# Patient Record
Sex: Female | Born: 1995
Health system: Southern US, Community
[De-identification: ages and names within clinical notes are randomized; demographics above are authoritative.]

## PROBLEM LIST (undated history)

## (undated) ENCOUNTER — Inpatient Hospital Stay (HOSPITAL_COMMUNITY): Payer: Self-pay

## (undated) DIAGNOSIS — E119 Type 2 diabetes mellitus without complications: Secondary | ICD-10-CM

---

## 2015-01-14 ENCOUNTER — Encounter (HOSPITAL_COMMUNITY): Payer: Self-pay | Admitting: Medical

## 2015-01-14 ENCOUNTER — Inpatient Hospital Stay (HOSPITAL_COMMUNITY): Payer: BLUE CROSS/BLUE SHIELD

## 2015-01-14 ENCOUNTER — Inpatient Hospital Stay (HOSPITAL_COMMUNITY)
Admission: AD | Admit: 2015-01-14 | Discharge: 2015-01-14 | Disposition: A | Discharge status: Home / Self Care | Payer: BLUE CROSS/BLUE SHIELD | Source: Ambulatory Visit | Attending: Family Medicine | Admitting: Family Medicine

## 2015-01-14 DIAGNOSIS — Z3A14 14 weeks gestation of pregnancy: Secondary | ICD-10-CM

## 2015-01-14 DIAGNOSIS — O209 Hemorrhage in early pregnancy, unspecified: Secondary | ICD-10-CM | POA: Insufficient documentation

## 2015-01-14 DIAGNOSIS — Z3A13 13 weeks gestation of pregnancy: Secondary | ICD-10-CM | POA: Diagnosis not present

## 2015-01-14 HISTORY — DX: Type 2 diabetes mellitus without complications: E11.9

## 2015-01-14 LAB — CBC WITH DIFFERENTIAL/PLATELET
Basophils Absolute: 0 10*3/uL (ref 0.0–0.1)
Basophils Relative: 0 % (ref 0–1)
Eosinophils Absolute: 0 10*3/uL (ref 0.0–0.7)
Eosinophils Relative: 0 % (ref 0–5)
HEMATOCRIT: 36.4 % (ref 36.0–46.0)
HEMOGLOBIN: 13.2 g/dL (ref 12.0–15.0)
LYMPHS ABS: 2.6 10*3/uL (ref 0.7–4.0)
LYMPHS PCT: 28 % (ref 12–46)
MCH: 30.4 pg (ref 26.0–34.0)
MCHC: 36.3 g/dL — ABNORMAL HIGH (ref 30.0–36.0)
MCV: 83.9 fL (ref 78.0–100.0)
Monocytes Absolute: 0.6 10*3/uL (ref 0.1–1.0)
Monocytes Relative: 7 % (ref 3–12)
NEUTROS ABS: 5.9 10*3/uL (ref 1.7–7.7)
Neutrophils Relative %: 65 % (ref 43–77)
PLATELETS: 244 10*3/uL (ref 150–400)
RBC: 4.34 MIL/uL (ref 3.87–5.11)
RDW: 13 % (ref 11.5–15.5)
WBC: 9.2 10*3/uL (ref 4.0–10.5)

## 2015-01-14 LAB — HCG, QUANTITATIVE, PREGNANCY: hCG, Beta Chain, Quant, S: 42445 m[IU]/mL — ABNORMAL HIGH (ref <5)

## 2015-01-14 LAB — WET PREP, GENITAL
Trich, Wet Prep: NONE SEEN
WBC WET PREP: NONE SEEN
Yeast Wet Prep HPF POC: NONE SEEN

## 2015-01-14 LAB — ABO/RH: ABO/RH(D): B POS

## 2015-01-14 NOTE — Discharge Instructions (Signed)

## 2015-01-14 NOTE — MAU Provider Note (Signed)
History     CSN: [id number]  Arrival date and time: 01/14/15 1259   First Provider Initiated Contact with Patient 01/14/15 1323      Chief Complaint  Patient presents with  . Possible Pregnancy  . Abdominal Pain  . Vaginal Bleeding   HPI  Ms. Tiffany Saunders is a 19 y.o. G1P0 at unknown GA who presents to MAU today with complaint of vaginal bleeding. The patient states +UPT at school in March and then [place] bleeding [place]. She states that lightened to spotting only today. She denies abdominal pain, fever, N/V/D, vaginal discharge or fever. She states her last normal periods was early January.    OB History    Gravida Para Term Preterm AB TAB SAB Ectopic Multiple Living   1               Past Medical History  Diagnosis Date  . Diabetes mellitus without complication     History reviewed. No pertinent past surgical history.  History reviewed. No pertinent family history.  History  Substance Use Topics  . Smoking status: Never Smoker   . Smokeless tobacco: Not on file  . Alcohol Use: No    Allergies: No Known Allergies  Prescriptions prior to admission  Medication Sig Dispense Refill Last Dose  . insulin aspart (NOVOLOG) 100 UNIT/ML FlexPen Inject 1-30 Units into the skin 3 (three) times daily with meals. 1 unit of insulin/10carbs   01/14/2015 at Unknown time  . insulin glargine (LANTUS) 100 UNIT/ML injection Inject 30 Units into the skin at bedtime.   01/13/2015 at Unknown time    Review of Systems  Constitutional: Negative for fever and malaise/fatigue.  Gastrointestinal: Negative for nausea, vomiting, abdominal pain, diarrhea and constipation.  Genitourinary:       Neg - vaginal discharge + vaginal bleeding  Neurological: Negative for dizziness.   Physical Exam   Blood pressure 132/73, pulse 108, temperature 98.1 F (36.7 C), temperature source Oral, resp. rate 18, height 5' (1.524 m), weight 141 lb (63.957 kg), last menstrual  period 10/08/2014.  Physical Exam  Nursing note and vitals reviewed. Constitutional: She is oriented to person, place, and time. She appears well-developed and well-nourished. No distress.  HENT:  Head: Normocephalic.  Cardiovascular: Normal rate.   Respiratory: Effort normal.  GI: Soft. She exhibits no distension and no mass. There is no tenderness. There is no rebound and no guarding.  Genitourinary: Uterus is enlarged (slightly). Uterus is not tender. Cervix exhibits no motion tenderness, no discharge and no friability. Right adnexum displays no mass and no tenderness. Left adnexum displays no mass and no tenderness. There is bleeding (scant light pink) in the vagina. Vaginal discharge (scant thin, pink discharge noted) found.  Neurological: She is alert and oriented to person, place, and time.  Skin: Skin is warm and dry. No erythema.  Psychiatric: She has a normal mood and affect.   Results for orders placed or performed during the hospital encounter of 01/14/15 (from the past 24 hour(s))  Wet prep, genital     Status: Abnormal   Collection Time: 01/14/15  2:09 PM  Result Value Ref Range   Yeast Wet Prep HPF POC NONE SEEN NONE SEEN   Trich, Wet Prep NONE SEEN NONE SEEN   Clue Cells Wet Prep HPF POC FEW (A) NONE SEEN   WBC, Wet Prep HPF POC NONE SEEN NONE SEEN  CBC with Differential/Platelet     Status: Abnormal   Collection  Time: 01/14/15  2:19 PM  Result Value Ref Range   WBC 9.2 4.0 - 10.5 K/uL   RBC 4.34 3.87 - 5.11 MIL/uL   Hemoglobin 13.2 12.0 - 15.0 g/dL   HCT 16.1 09.6 - 04.5 %   MCV 83.9 78.0 - 100.0 fL   MCH 30.4 26.0 - 34.0 pg   MCHC 36.3 (H) 30.0 - 36.0 g/dL   RDW 40.9 81.1 - 91.4 %   Platelets 244 150 - 400 K/uL   Neutrophils Relative % 65 43 - 77 %   Neutro Abs 5.9 1.7 - 7.7 K/uL   Lymphocytes Relative 28 12 - 46 %   Lymphs Abs 2.6 0.7 - 4.0 K/uL   Monocytes Relative 7 3 - 12 %   Monocytes Absolute 0.6 0.1 - 1.0 K/uL   Eosinophils Relative 0 0 - 5 %    Eosinophils Absolute 0.0 0.0 - 0.7 K/uL   Basophils Relative 0 0 - 1 %   Basophils Absolute 0.0 0.0 - 0.1 K/uL  ABO/Rh     Status: None   Collection Time: 01/14/15  2:19 PM  Result Value Ref Range   ABO/RH(D) B POS   hCG, quantitative, pregnancy     Status: Abnormal   Collection Time: 01/14/15  2:19 PM  Result Value Ref Range   hCG, Beta Chain, Quant, S 42445 (H) <5 mIU/mL   US Ob Comp Less 14 Wks  01/14/2015   CLINICAL DATA:  Initial encounter for positive pregnancy test with vaginal bleeding.  EXAM: OBSTETRIC <14 WK ULTRASOUND  TECHNIQUE: Transabdominal ultrasound was performed for evaluation of the gestation as well as the maternal uterus and adnexal regions.  COMPARISON:  None.  FINDINGS: Intrauterine gestational sac: Visualized/normal in shape.  Yolk sac:  Nonvisualized  Embryo:  Visualized  Cardiac Activity: Visualize  Heart Rate: 156 bpm  CRL:   7.8 cm 13 w 6 d                  [place] EDC: 07/15/2015.  Maternal uterus/adnexae: No evidence for subchorionic hemorrhage. Maternal right ovary is unremarkable. Maternal left ovary cannot be visualized.  IMPRESSION: Single living intrauterine gestation at estimated 13 week 6 day gestational age by crown-rump length. Dedicated fetal anatomic survey is recommended by [redacted] weeks gestational age.   Electronically Signed   By: Kennith Center M.D.   On: 01/14/2015 16:23    MAU Course  Procedures None  MDM + UPT  CBC, ABO/Rh, quant hCG, HIV, RPR, wet prep, GC/Chlamydia and [place] today  Assessment and Plan  A: SIUP at [redacted]w[redacted]d Vaginal bleeding in pregnancy  P: Discharge home Bleeding precautions discussed Patient given pregnancy confirmation letter given with list of area OB providers Second trimester warning signs discussed Patient advised to start prenatal vitamins daily Patient may return to MAU as needed or if her condition were to change or worsen   Marny Lowenstein, PA-C  01/14/2015, 4:30 PM

## 2015-01-14 NOTE — MAU Note (Signed)
3 wks ago went to health office at her school, was told she was pregnant.  Easter weekend to April 2 experienced heavy bleeding. This week the bleeding is lighter.  Did not come in them as she did not have a ride. Had cramps, but have stopped.

## 2015-01-15 LAB — RPR: RPR Ser Ql: NONREACTIVE

## 2015-01-17 LAB — GC/CHLAMYDIA PROBE AMP (~~LOC~~) NOT AT ARMC
CHLAMYDIA, DNA PROBE: NEGATIVE
Neisseria Gonorrhea: NEGATIVE

## 2015-01-21 LAB — HIV 1/2 AB DIFFERENTIATION
HIV 1 AB: NONREACTIVE
HIV 2 AB: NONREACTIVE

## 2015-01-21 LAB — RNA QUALITATIVE: HIV 1 RNA QUALITATIVE: NEGATIVE

## 2015-01-21 LAB — HIV ANTIBODY (ROUTINE TESTING W REFLEX): HIV Screen 4th Generation wRfx: REACTIVE — AB

## 2015-03-30 DIAGNOSIS — O0930 Supervision of pregnancy with insufficient antenatal care, unspecified trimester: Secondary | ICD-10-CM | POA: Insufficient documentation

## 2015-11-19 ENCOUNTER — Encounter (HOSPITAL_COMMUNITY): Payer: Self-pay | Admitting: *Deleted

## 2015-12-08 IMAGING — US US OB COMP LESS 14 WK
1 series · 14 of 28 positions shown · non-contrast
Comparison: None.

CLINICAL DATA: Initial encounter for positive pregnancy test with
vaginal bleeding.

EXAM:
OBSTETRIC <14 WK ULTRASOUND
TECHNIQUE: Transabdominal ultrasound was performed for evaluation of the
gestation as well as the maternal uterus and adnexal regions.

[Series 1: us ob comp less 14 wk · 34 acquisitions, 14 frames shown]
[im 2/34]
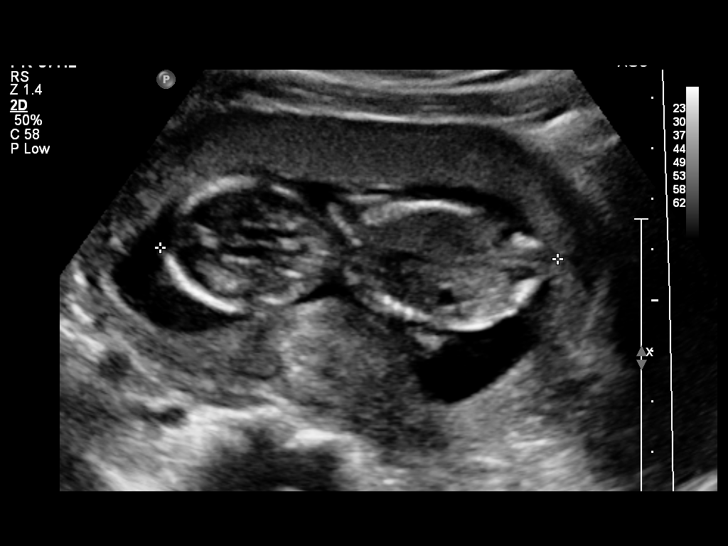
[im 4/34]
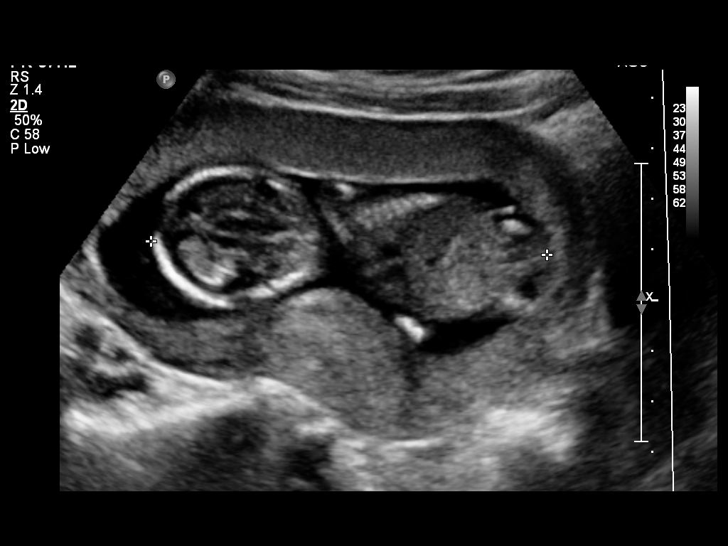
[im 7/34]
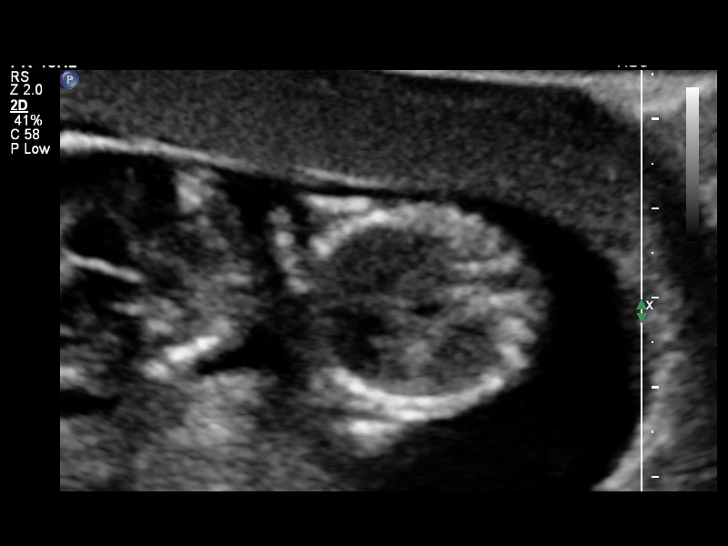
[im 9/34]
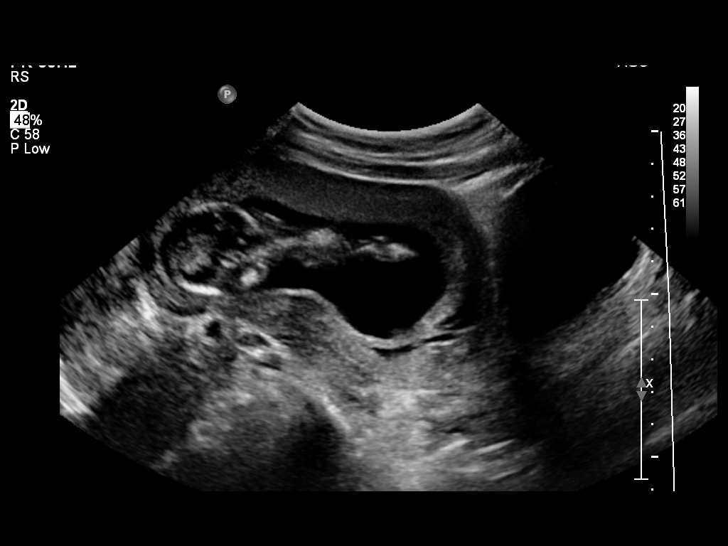
[im 12/34]
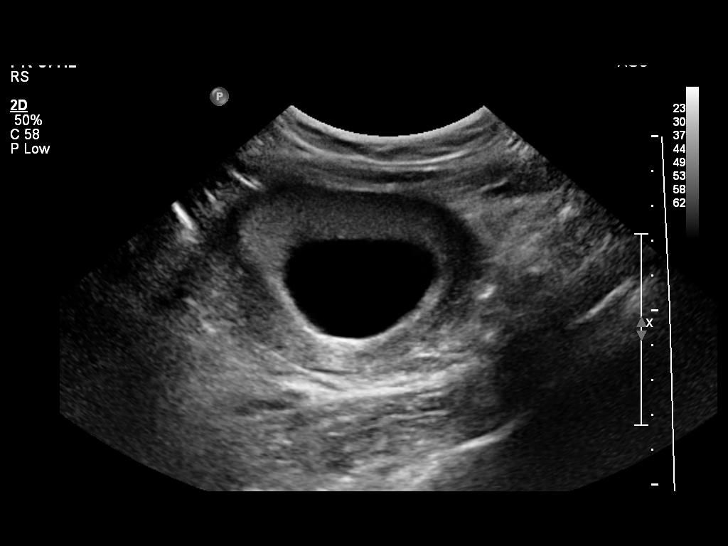
[im 14/34]
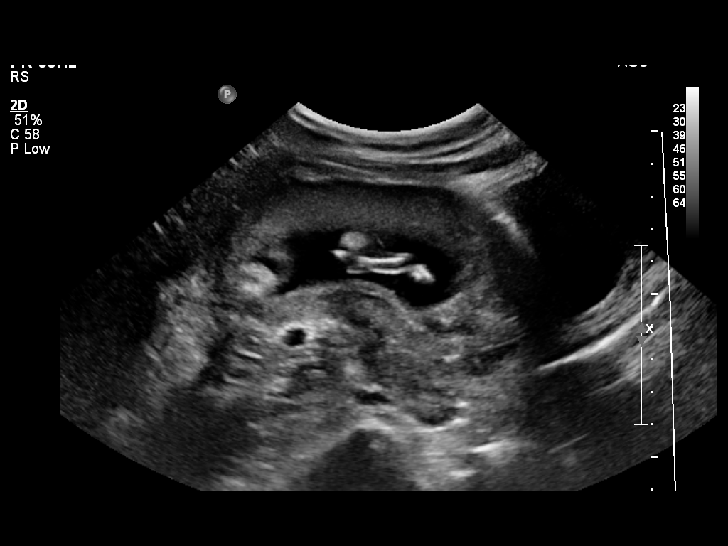
[im 16/34]
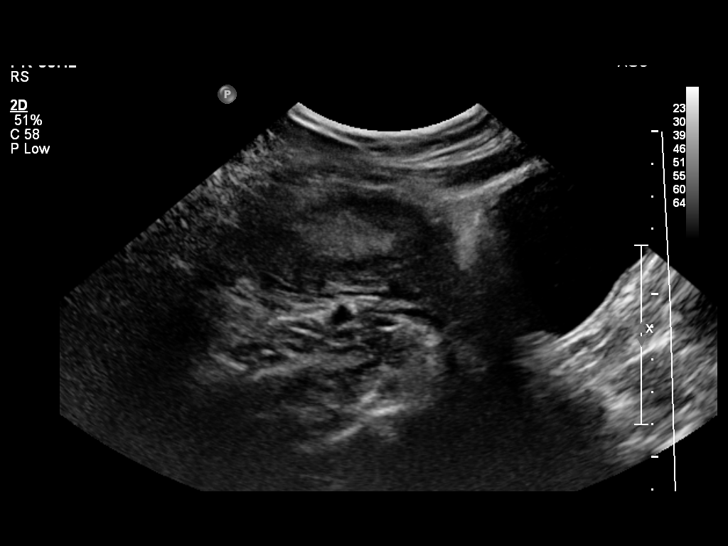
[im 19/34]
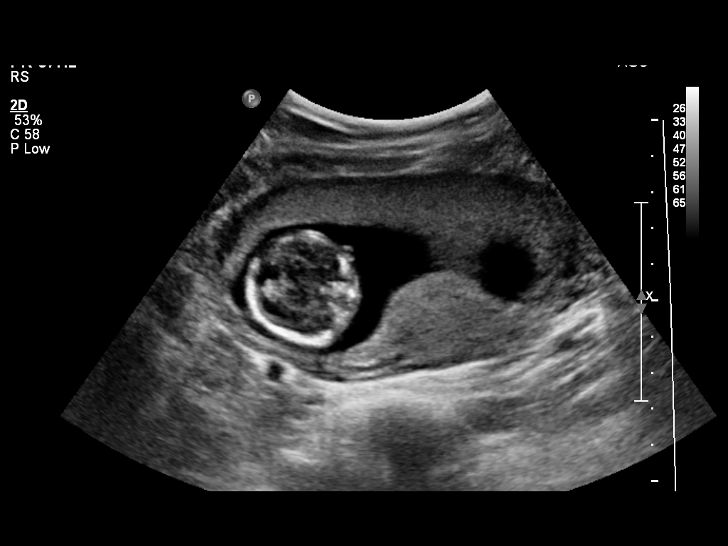
[im 21/34]
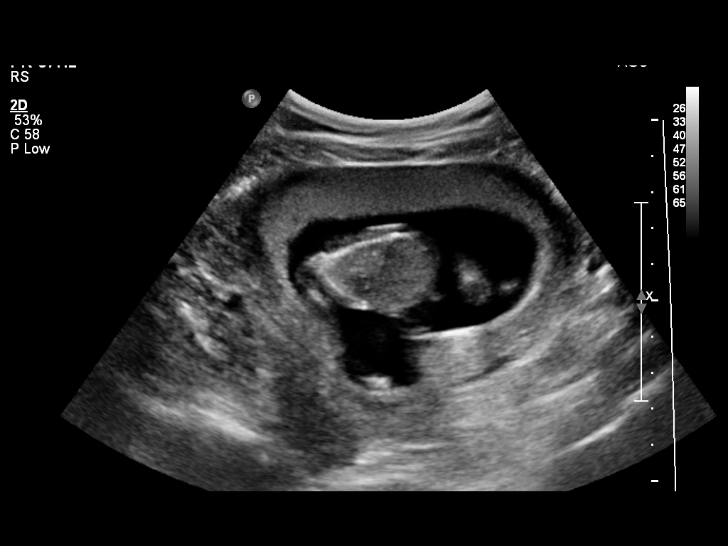
[im 24/34]
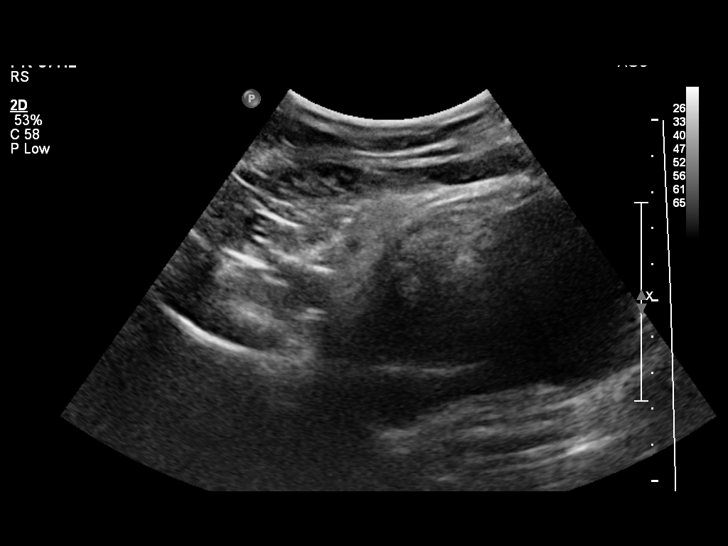
[im 26/34]
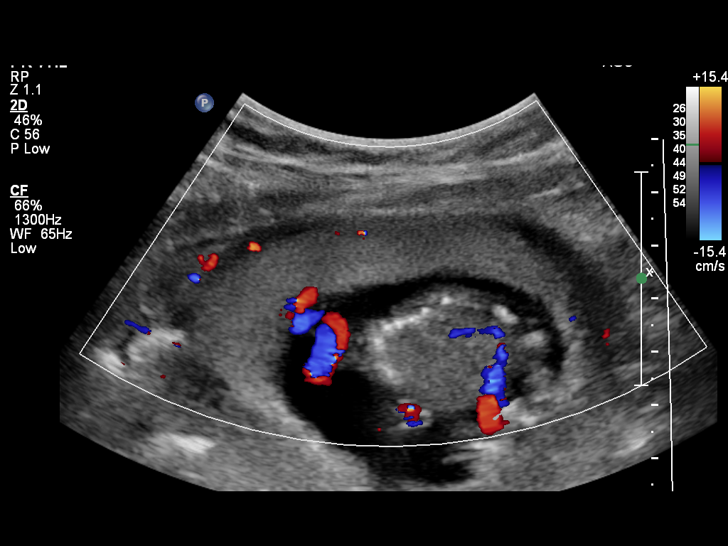
[im 29/34]
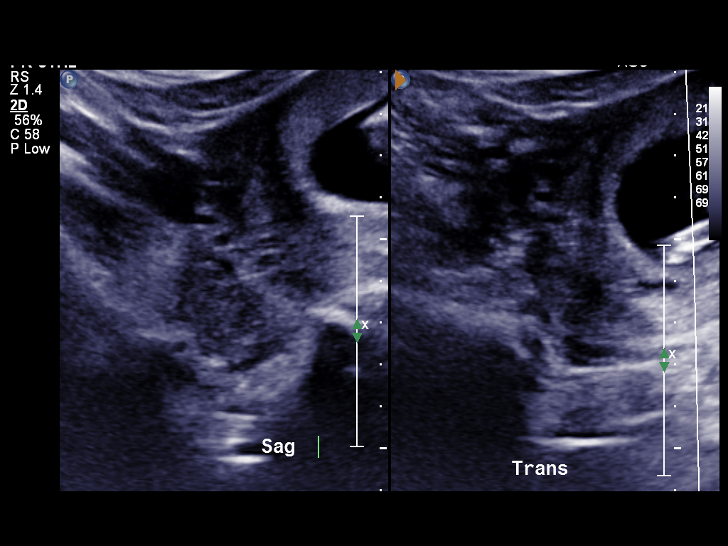
[im 31/34]
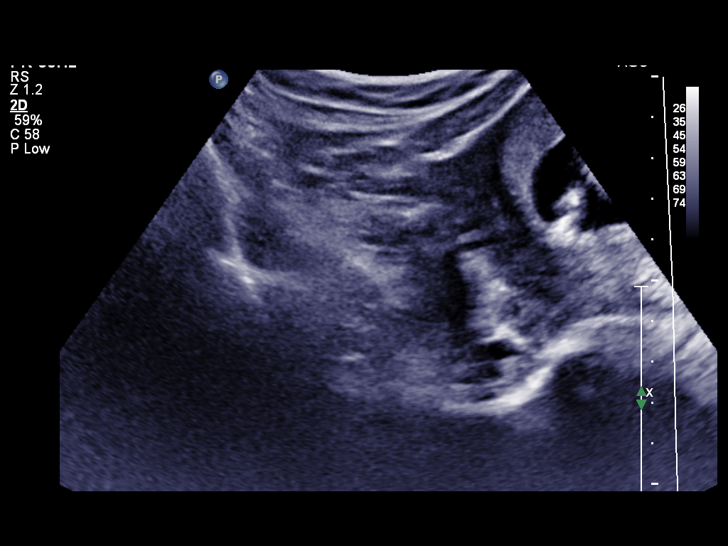
[im 34/34]
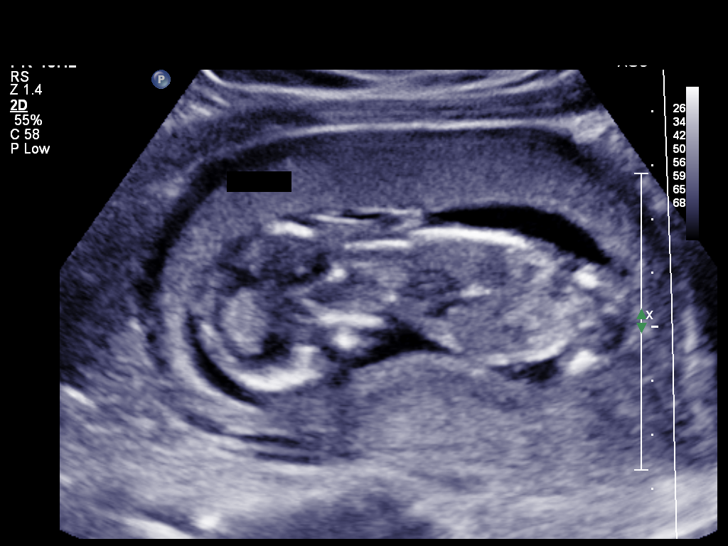

[14 of 28 positions shown; findings below may reference images not displayed]

FINDINGS: Intrauterine gestational sac: Visualized/normal in shape.

Yolk sac:  Nonvisualized

Embryo:  Visualized

Cardiac Activity: Visualize

Heart Rate: 156 bpm

CRL:   7.8 cm 13 w 6 d                  US EDC: 07/15/2015.

Maternal uterus/adnexae: No evidence for subchorionic hemorrhage.
Maternal right ovary is unremarkable. Maternal left ovary cannot be
visualized.
IMPRESSION: Single living intrauterine gestation at estimated 13 week 6 day
gestational age by crown-rump length. Dedicated fetal anatomic
survey is recommended by 18 weeks gestational age.

## 2020-01-28 ENCOUNTER — Ambulatory Visit (INDEPENDENT_AMBULATORY_CARE_PROVIDER_SITE_OTHER): Payer: 59 | Admitting: Medical-Surgical

## 2020-01-28 ENCOUNTER — Other Ambulatory Visit: Payer: Self-pay

## 2020-01-28 ENCOUNTER — Encounter: Payer: Self-pay | Admitting: Medical-Surgical

## 2020-01-28 VITALS — BP 126/76 | HR 89 | Temp 98.5°F | Ht 60.5 in | Wt 150.9 lb

## 2020-01-28 DIAGNOSIS — Z3041 Encounter for surveillance of contraceptive pills: Secondary | ICD-10-CM

## 2020-01-28 DIAGNOSIS — E109 Type 1 diabetes mellitus without complications: Secondary | ICD-10-CM | POA: Diagnosis not present

## 2020-01-28 DIAGNOSIS — Z7689 Persons encountering health services in other specified circumstances: Secondary | ICD-10-CM

## 2020-01-28 LAB — POCT GLYCOSYLATED HEMOGLOBIN (HGB A1C): Hemoglobin A1C: 10.4 % — AB (ref 4.0–5.6)

## 2020-01-28 MED ORDER — SPRINTEC 28 0.25-35 MG-MCG PO TABS: 1.0000 | ORAL_TABLET | Freq: Every day | ORAL | 3 refills | Status: DC

## 2020-01-28 NOTE — Progress Notes (Signed)
New Patient Office Visit  Subjective:  Patient ID: Tiffany Saunders, female    DOB: [birth year]  Age: 24 y.o. MRN: [id number]  CC: No chief complaint on file.   HPI Tiffany Saunders presents to establish care.  Diabetes mellitus type 1- currently using Novolog and Basaglar. Not checking blood sugars regularly but reports she is getting a continuous monitor soon. Using 1-8 units Novolog TID with meals depending on carbs. Taking 20 units Basaglar at night. Admits to eating sweets, difficulty with adherence to diabetic diet recommendations.   Birth control- long-term birth control with Sprintec. Tolerating the medication well. Regular periods, skips a period once or twice a year.   Past Medical History:  Diagnosis Date  . Diabetes mellitus without complication [facility])     Past Surgical History:  Procedure Laterality Date  . CESAREAN SECTION      Family History  Problem Relation Age of Onset  . Hypertension Mother   . Diabetes Maternal Grandmother   . Hypertension Paternal Grandmother     Social History   Socioeconomic History  . Marital status: Single    Spouse name: Not on file  . Number of children: Not on file  . Years of education: Not on file  . Highest education level: Not on file  Occupational History  . Not on file  Tobacco Use  . Smoking status: Never Smoker  . Smokeless tobacco: Never Used  Substance and Sexual Activity  . Alcohol use: Yes    Comment: 1-2 glasses of wine/month  . Drug use: Yes    Types: Marijuana    Comment: daily use  . Sexual activity: Yes    Partners: Male    Birth control/protection: Pill  Other Topics Concern  . Not on file  Social History Narrative  . Not on file   Social Determinants of Health   Financial Resource Strain:   . Difficulty of Paying Living Expenses:   Food Insecurity:   . Worried About Programme researcher, broadcasting/film/video in the Last Year:   . Barista in the Last Year:   Transportation Needs:   . Freight forwarder  (Medical):   Marland Kitchen Lack of Transportation (Non-Medical):   Physical Activity:   . Days of Exercise per Week:   . Minutes of Exercise per Session:   Stress:   . Feeling of Stress :   Social Connections:   . Frequency of Communication with Friends and Family:   . Frequency of Social Gatherings with Friends and Family:   . Attends Religious Services:   . Active Member of Clubs or Organizations:   . Attends Banker Meetings:   Marland Kitchen Marital Status:   Intimate Partner Violence:   . Fear of Current or Ex-Partner:   . Emotionally Abused:   Marland Kitchen Physically Abused:   . Sexually Abused:     ROS Review of Systems  Constitutional: Negative for chills, fatigue, fever and unexpected weight change.  Respiratory: Negative for cough, chest tightness, shortness of breath and wheezing.   Cardiovascular: Negative for chest pain, palpitations and leg swelling.  Gastrointestinal: Negative for abdominal pain, constipation, diarrhea and nausea.  Genitourinary: Negative for dysuria, frequency and urgency.  Neurological: Negative for numbness.  Psychiatric/Behavioral: Negative for self-injury, sleep disturbance and suicidal ideas. The patient is not nervous/anxious.     Objective:   Today's Vitals: BP 126/76   Pulse 89   Temp 98.5 F (36.9 C) (Oral)   Ht 5' 0.5" (1.537 m)  Wt 150 lb 14.4 oz (68.4 kg)   LMP 01/25/2020   SpO2 97%   BMI 28.99 kg/m   Physical Exam Vitals reviewed.  Constitutional:      General: She is not in acute distress.    Appearance: Normal appearance.  HENT:     Head: Normocephalic and atraumatic.  Cardiovascular:     Rate and Rhythm: Normal rate and regular rhythm.     Pulses: Normal pulses.     Heart sounds: Normal heart sounds. No murmur. No friction rub. No gallop.   Pulmonary:     Effort: Pulmonary effort is normal. No respiratory distress.     Breath sounds: Normal breath sounds. No wheezing.  Skin:    General: Skin is warm and dry.  Neurological:      Mental Status: She is alert and oriented to person, place, and time.  Psychiatric:        Mood and Affect: Mood normal.        Behavior: Behavior normal.        Thought Content: Thought content normal.        Judgment: Judgment normal.     Assessment & Plan:   1. Visit for birth control pills maintenance Continue Sprintec, refills provided. - SPRINTEC 28 0.25-35 MG-MCG tablet; Take 1 tablet by mouth daily.  Dispense: 3 Package; Refill: 3  2. Type 1 diabetes mellitus without complications (HCC) POCT HgbA1c 10.4%. Referring to [facility] per patient request. - Ambulatory referral to Endocrinology - POCT glycosylated hemoglobin (Hb A1C)  Outpatient Encounter Medications as of 01/28/2020  Medication Sig  . HUMALOG KWIKPEN 100 UNIT/ML KwikPen AS DIRECTED ON A SLIDING SCALE  . Insulin Glargine (BASAGLAR KWIKPEN) 100 UNIT/ML Inject 20 Units into the skin at bedtime.  . SPRINTEC 28 0.25-35 MG-MCG tablet Take 1 tablet by mouth daily.  Tyler Aas FLEXTOUCH 100 UNIT/ML FlexTouch Pen Inject 20 Units into the skin at bedtime.  . [DISCONTINUED] SPRINTEC 28 0.25-35 MG-MCG tablet Take 1 tablet by mouth daily.  . insulin aspart (NOVOLOG) 100 UNIT/ML FlexPen Inject 1-30 Units into the skin 3 (three) times daily with meals. 1 unit of insulin/10carbs  . [DISCONTINUED] insulin glargine (LANTUS) 100 UNIT/ML injection Inject 30 Units into the skin at bedtime.   No facility-administered encounter medications on file as of 01/28/2020.    Follow-up: Return in about 1 year (around 01/27/2021) for birth control follow up; CPE at your convenience.   Clearnce Sorrel, DNP, APRN, FNP-BC [facility]

## 2020-02-22 ENCOUNTER — Telehealth: Payer: Self-pay

## 2020-02-22 ENCOUNTER — Encounter: Payer: Self-pay | Admitting: Internal Medicine

## 2020-02-22 DIAGNOSIS — Z3041 Encounter for surveillance of contraceptive pills: Secondary | ICD-10-CM

## 2020-02-22 MED ORDER — SPRINTEC 28 0.25-35 MG-MCG PO TABS: 1.0000 | ORAL_TABLET | Freq: Every day | ORAL | 3 refills | Status: DC

## 2020-02-22 NOTE — Progress Notes (Signed)
This encounter was created in error - please disregard.

## 2020-02-22 NOTE — Telephone Encounter (Signed)
Pt called and stated requesting that we resend her Rx's for Sprintec and her insulins to [facility] pharmacy. Advised pt that she would need to contact her Endocrinologist regarding having the Rx's resent in but Joy verbally authorized resending the Sprintec. I called [facility] and cancelled the Rx there and verified that she had not picked the Rx up. Rx has been resent to the requested pharmacy.

## 2020-02-23 ENCOUNTER — Telehealth: Payer: Self-pay

## 2020-02-23 MED FILL — FEMYNOR 0.25-35 MG-MCG TABS: 0.25-35 | 84 days supply | Qty: 84 | Fill #0

## 2020-02-23 NOTE — Telephone Encounter (Signed)
Ok for generic

## 2020-02-23 NOTE — Telephone Encounter (Signed)
The pharmacy called and states the Sprintec was sent as DAW. They state the patient is ok with generic. Is it ok for generic?

## 2020-02-23 NOTE — Telephone Encounter (Signed)
Pharmacy advised  

## 2020-02-25 MED FILL — HUMALOG 100 UNITS/ML KWIKPE: 100 | 90 days supply | Qty: 63 | Fill #0

## 2020-03-01 ENCOUNTER — Ambulatory Visit: Payer: 59 | Admitting: Internal Medicine

## 2020-03-01 DIAGNOSIS — Z0289 Encounter for other administrative examinations: Secondary | ICD-10-CM

## 2020-03-01 NOTE — Progress Notes (Deleted)
Name: Tiffany Saunders  MRN/ DOB: [id number], [birth year]   Age/ Sex: 24 y.o., female    PCP: Christen Butter, NP   Reason for Endocrinology Evaluation: Type 1 Diabetes Mellitus     Date of Initial Endocrinology Visit: 03/01/2020     PATIENT IDENTIFIER: Tiffany Saunders is a 24 y.o. female with a past medical history of T1DM. The patient presented for initial endocrinology clinic visit on 03/01/2020 for consultative assistance with her diabetes management.    HPI: Tiffany Saunders was    Diagnosed with DM *** Prior Medications tried/Intolerance: *** Currently checking blood sugars *** x / day,  before breakfast and ***.  Hypoglycemia episodes : ***               Symptoms: ***                 Frequency: ***/  Hemoglobin A1c has ranged from *** in ***, peaking at *** in ***. Patient required assistance for hypoglycemia:  Patient has required hospitalization within the last 1 year from hyper or hypoglycemia:   In terms of diet, the patient ***   HOME DIABETES REGIMEN: Tresiba Humalog   Statin: no ACE-I/ARB: no Prior Diabetic Education: {Yes/No:11203}   METER DOWNLOAD SUMMARY: Date range evaluated: *** Fingerstick Blood Glucose Tests = *** Average Number Tests/Day = *** Overall Mean FS Glucose = *** Standard Deviation = ***  BG Ranges: Low = *** High = ***   Hypoglycemic Events/30 Days: BG < 50 = *** Episodes of symptomatic severe hypoglycemia = ***   DIABETIC COMPLICATIONS: Microvascular complications:   ***  Denies: ***  Last eye exam: Completed   Macrovascular complications:   ***  Denies: CAD, PVD, CVA   PAST HISTORY: Past Medical History:  Past Medical History:  Diagnosis Date  . Diabetes mellitus without complication [facility])     Past Surgical History:  Past Surgical History:  Procedure Laterality Date  . CESAREAN SECTION        Social History:  reports that she has never smoked. She has never used smokeless tobacco. She reports current alcohol use. She  reports current drug use. Drug: Marijuana. Family History:  Family History  Problem Relation Age of Onset  . Hypertension Mother   . Diabetes Maternal Grandmother   . Hypertension Paternal Grandmother       HOME MEDICATIONS: Allergies as of 03/01/2020   No Known Allergies     Medication List       Accurate as of Mar 01, 2020  7:12 AM. If you have any questions, ask your nurse or doctor.        Basaglar KwikPen 100 UNIT/ML Inject 20 Units into the skin at bedtime.   HumaLOG KwikPen 100 UNIT/ML KwikPen Generic drug: insulin lispro AS DIRECTED ON A SLIDING SCALE   Sprintec 28 0.25-35 MG-MCG tablet Generic drug: norgestimate-ethinyl estradiol Take 1 tablet by mouth daily.   Evaristo Bury FlexTouch 100 UNIT/ML FlexTouch Pen Generic drug: insulin degludec Inject 20 Units into the skin at bedtime.        ALLERGIES: No Known Allergies   REVIEW OF SYSTEMS: A comprehensive ROS was conducted with the patient and is negative except as per HPI and below:  ROS    OBJECTIVE:   VITAL SIGNS: There were no vitals taken for this visit.   PHYSICAL EXAM:  General: Pt appears well and is in NAD  Hydration: Well-hydrated with moist mucous membranes and good skin turgor  HEENT: Head: Unremarkable with good dentition.  Oropharynx clear without exudate.  Eyes: External eye exam normal without stare, lid lag or exophthalmos.  EOM intact.  PERRL.  Neck: General: Supple without adenopathy or carotid bruits. Thyroid: Thyroid size normal.  No goiter or nodules appreciated. No thyroid bruit.  Lungs: Clear with good BS bilat with no rales, rhonchi, or wheezes  Heart: RRR with normal S1 and S2 and no gallops; no murmurs; no rub  Abdomen: Normoactive bowel sounds, soft, nontender, without masses or organomegaly palpable  Extremities:  Lower extremities - No pretibial edema. No lesions.  Skin: Normal texture and temperature to palpation. No rash noted. No Acanthosis nigricans/skin tags. No  lipohypertrophy.  Neuro: MS is good with appropriate affect, pt is alert and Ox3    DM foot exam:    DATA REVIEWED:  Lab Results  Component Value Date   HGBA1C 10.4 (A) 01/28/2020     ASSESSMENT / PLAN / RECOMMENDATIONS:   1) Type 1 Diabetes Mellitus, Poorly controlled, With*** complications - Most recent A1c of 10.4 %. Goal A1c < 7.0 %.    Plan: GENERAL:  ***  MEDICATIONS:  ***  EDUCATION / INSTRUCTIONS:  BG monitoring instructions: Patient is instructed to check her blood sugars *** times a day, ***.  Call [place] [facility] clinic if: BG persistently < 70 or > 300. . I reviewed the Rule of 15 for the treatment of hypoglycemia in detail with the patient. Literature supplied.   2) Diabetic complications:   Eye: Does *** have known diabetic retinopathy.   Neuro/ Feet: Does *** have known diabetic peripheral neuropathy.  Renal: Patient does *** have known baseline CKD. She is *** on an ACEI/ARB at present.Check urine albumin/creatinine ratio yearly starting at time of diagnosis. If albuminuria is positive, treatment is geared toward better glucose, blood pressure control and use of ACE inhibitors or ARBs. Monitor electrolytes and creatinine once to twice yearly.   3) Lipids: Patient is *** on a statin.    4) Hypertension: ***  at goal of < 140/90 mmHg.       Signed electronically by: Mack Guise, MD  [facility]  [facility] [address] [place], [place] [address] Phone: [phone] FAX: [phone]   CC: [address], [place] [address] Phone: [phone]  Fax: [phone]    Return to Endocrinology clinic as below: Future Appointments  Date Time Provider [place]  03/01/2020 11:10 AM Taleigha Pinson, Melanie Crazier, MD [facility] None

## 2020-03-06 ENCOUNTER — Telehealth: Payer: 59 | Admitting: Nurse Practitioner

## 2020-03-06 DIAGNOSIS — B373 Candidiasis of vulva and vagina: Secondary | ICD-10-CM

## 2020-03-06 DIAGNOSIS — B3731 Acute candidiasis of vulva and vagina: Secondary | ICD-10-CM

## 2020-03-06 MED ORDER — FLUCONAZOLE 150 MG PO TABS: 150.0000 mg | ORAL_TABLET | Freq: Once | ORAL | 0 refills | Status: AC

## 2020-03-06 NOTE — Progress Notes (Signed)

## 2020-03-08 ENCOUNTER — Telehealth: Payer: 59 | Admitting: Physician Assistant

## 2020-03-08 DIAGNOSIS — N760 Acute vaginitis: Secondary | ICD-10-CM

## 2020-03-08 MED ORDER — METRONIDAZOLE 500 MG PO TABS: 500.0000 mg | ORAL_TABLET | Freq: Two times a day (BID) | ORAL | 0 refills | Status: DC

## 2020-03-08 MED FILL — metroNIDAZOLE 500 MG TABS: 500 | 7 days supply | Qty: 14 | Fill #0

## 2020-03-08 MED FILL — FLUCONAZOLE 150 MG TABLET: 150 | 1 days supply | Qty: 1 | Fill #0

## 2020-03-08 NOTE — Progress Notes (Signed)
We are sorry that you are not feeling well. Here is how we plan to help! Based on what you shared with me it looks like you: May have a vaginosis due to bacteria  Vaginosis is an inflammation of the vagina that can result in discharge, itching and pain. The cause is usually a change in the normal balance of vaginal bacteria or an infection. Vaginosis can also result from reduced estrogen levels after menopause.  The most common causes of vaginosis are:   Bacterial vaginosis which results from an overgrowth of one on several organisms that are normally present in your vagina.   Yeast infections which are caused by a naturally occurring fungus called candida.   Vaginal atrophy (atrophic vaginosis) which results from the thinning of the vagina from reduced estrogen levels after menopause.   Trichomoniasis which is caused by a parasite and is commonly transmitted by sexual intercourse.  Factors that increase your risk of developing vaginosis include: Marland Kitchen Medications, such as antibiotics and steroids . Uncontrolled diabetes . Use of hygiene products such as bubble bath, vaginal spray or vaginal deodorant . Douching . Wearing damp or tight-fitting clothing . Using an intrauterine device (IUD) for birth control . Hormonal changes, such as those associated with pregnancy, birth control pills or menopause . Sexual activity . Having a sexually transmitted infection  Your treatment plan is Metronidazole or Flagyl 500mg  twice a day for 7 days.  I have electronically sent this prescription into the pharmacy that you have chosen.   If you are not improving following treatment with Flagyl, please be evaluated in person by a medical provider.   Be sure to take all of the medication as directed. Stop taking any medication if you develop a rash, tongue swelling or shortness of breath. Mothers who are breast feeding should consider pumping and discarding their breast milk while on these antibiotics.  However, there is no consensus that infant exposure at these doses would be harmful.  Remember that medication creams can weaken latex condoms.   HOME CARE:  Good hygiene may prevent some types of vaginosis from recurring and may relieve some symptoms:  . Avoid baths, hot tubs and whirlpool spas. Rinse soap from your outer genital area after a shower, and dry the area well to prevent irritation. Don't use scented or harsh soaps, such as those with deodorant or antibacterial action. Marland Kitchen Avoid irritants. These include scented tampons and pads. . Wipe from front to back after using the toilet. Doing so avoids spreading fecal bacteria to your vagina.  Other things that may help prevent vaginosis include:  Marland Kitchen Don't douche. Your vagina doesn't require cleansing other than normal bathing. Repetitive douching disrupts the normal organisms that reside in the vagina and can actually increase your risk of vaginal infection. Douching won't clear up a vaginal infection. . Use a latex condom. Both female and female latex condoms may help you avoid infections spread by sexual contact. . Wear cotton underwear. Also wear pantyhose with a cotton crotch. If you feel comfortable without it, skip wearing underwear to bed. Yeast thrives in Marland Kitchen Your symptoms should improve in the next day or two.  GET HELP RIGHT AWAY IF:  . You have pain in your lower abdomen ( pelvic area or over your ovaries) . You develop nausea or vomiting . You develop a fever . Your discharge changes or worsens . You have persistent pain with intercourse . You develop shortness of breath, a rapid pulse, or you faint.  These symptoms could be signs of problems or infections that need to be evaluated by a medical provider now.  MAKE SURE YOU    Understand these instructions.  Will watch your condition.  Will get help right away if you are not doing well or get worse.  Your e-visit answers were reviewed by a board  certified advanced clinical practitioner to complete your personal care plan. Depending upon the condition, your plan could have included both over the counter or prescription medications. Please review your pharmacy choice to make sure that you have choses a pharmacy that is open for you to pick up any needed prescription, Your safety is important to [place]. If you have drug allergies check your prescription carefully.   You can use MyChart to ask questions about today's visit, request a non-urgent call back, or ask for a work or school excuse for 24 hours related to this e-Visit. If it has been greater than 24 hours you will need to follow up with your provider, or enter a new e-Visit to address those concerns. You will get a MyChart message within the next two days asking about your experience. I hope that your e-visit has been valuable and will speed your recovery.  Greater than 5 minutes, yet less than 10 minutes of time have been spent researching, coordinating and implementing care for this patient today.

## 2020-04-06 ENCOUNTER — Telehealth: Payer: 59 | Admitting: Nurse Practitioner

## 2020-04-06 DIAGNOSIS — N898 Other specified noninflammatory disorders of vagina: Secondary | ICD-10-CM

## 2020-04-06 NOTE — Progress Notes (Signed)
Repeat evisit 

## 2020-04-06 NOTE — Progress Notes (Signed)
Based on what you shared with me it looks like you have vaginal discharge,that should be evaluated in a face to face office visit. According to your chat you were treated with same issue the first of June. Because you described the discharge as yellowish green, you really should have a face to face visit to have discharge tested to make sure you are being treated properly.    NOTE: If you entered your credit card information for this eVisit, you will not be charged. You may see a "hold" on your card for the $35 but that hold will drop off and you will not have a charge processed.  If you are having a true medical emergency please call 911.     For an urgent face to face visit, [facility] has four urgent care centers for your convenience:   . [facility]    [phone]                  Get Driving Directions  [address] [place], [place] [address] . 10 am to 8 pm Monday-Friday . 12 pm to 8 pm Saturday-Sunday   . [facility] at [facility]  [phone]                  Get Driving Directions  [address] [place], [place] [address] . 8 am to 8 pm Monday-Friday . 9 am to 6 pm Saturday . 11 am to 6 pm Sunday   . [facility] at [facility]  [phone]                  Get Driving Directions   [address] [place], [place] [address] . 8 am to 8 pm Monday-Friday . 8 am to 4 pm Saturday-Sunday    . [facility] at [facility] D[facility]  [phone]  [address] [place], [place] [address]  . Monday-Friday, 12 PM to 6 PM    Your e-visit answers were reviewed by a board certified advanced clinical practitioner to complete your personal care plan.  Thank you for using e-Visits.

## 2020-04-12 ENCOUNTER — Telehealth: Payer: Self-pay

## 2020-04-12 NOTE — Telephone Encounter (Signed)
Patient aware of Joy's response and recommendations. No further questions or concerns at this time.

## 2020-04-12 NOTE — Telephone Encounter (Signed)
Pt now has [employer] and is requesting a referral to a dentist. She said she has not seen one in a long time. I told her that most insurance companies do not require a referral to a dentist but I would ask Joy and see if there is anyone she recommends.

## 2020-04-12 NOTE — Telephone Encounter (Signed)
Most insurances do not require referrals for dental care. I would recommend she call the insurance company to see if there are preferred dentists and go from there. There are quite a few good dentists in the area she can choose from once she has the information. Checking out reviews online may also be helpful.

## 2020-04-22 ENCOUNTER — Telehealth: Payer: 59 | Admitting: Emergency Medicine

## 2020-04-22 DIAGNOSIS — B3731 Acute candidiasis of vulva and vagina: Secondary | ICD-10-CM

## 2020-04-22 DIAGNOSIS — B373 Candidiasis of vulva and vagina: Secondary | ICD-10-CM

## 2020-04-22 MED ORDER — FLUCONAZOLE 150 MG PO TABS: 150.0000 mg | ORAL_TABLET | Freq: Two times a day (BID) | ORAL | 0 refills | Status: DC | PRN

## 2020-04-22 MED FILL — FLUCONAZOLE 150 MG TABS: 150 | 3 days supply | Qty: 2 | Fill #0

## 2020-04-22 NOTE — Progress Notes (Signed)
Time spent: 10 min  We are sorry that you are not feeling well. Here is how we plan to help! Based on what you shared with me it looks like you: May have a yeast vaginosis  Vaginosis is an inflammation of the vagina that can result in discharge, itching and pain. The cause is usually a change in the normal balance of vaginal bacteria or an infection. Vaginosis can also result from reduced estrogen levels after menopause.  The most common causes of vaginosis are:   Bacterial vaginosis which results from an overgrowth of one on several organisms that are normally present in your vagina.   Yeast infections which are caused by a naturally occurring fungus called candida.   Vaginal atrophy (atrophic vaginosis) which results from the thinning of the vagina from reduced estrogen levels after menopause.   Trichomoniasis which is caused by a parasite and is commonly transmitted by sexual intercourse.  Factors that increase your risk of developing vaginosis include: Marland Kitchen Medications, such as antibiotics and steroids . Uncontrolled diabetes . Use of hygiene products such as bubble bath, vaginal spray or vaginal deodorant . Douching . Wearing damp or tight-fitting clothing . Using an intrauterine device (IUD) for birth control . Hormonal changes, such as those associated with pregnancy, birth control pills or menopause . Sexual activity . Having a sexually transmitted infection  Your treatment plan is A single Diflucan (fluconazole) 150mg  tablet once.  I have electronically sent this prescription into the pharmacy that you have chosen.  I have sent 2 doses.  Please take the second dose 72 hours after the first dose if your symptoms have not improved after the first dose. If your symptoms do not improve after fluconazole you will need a face to face evaluation for your symptoms by primary care provider or other clinician.   Be sure to take all of the medication as directed. Stop taking any medication  if you develop a rash, tongue swelling or shortness of breath. Mothers who are breast feeding should consider pumping and discarding their breast milk while on these antibiotics. However, there is no consensus that infant exposure at these doses would be harmful.  Remember that medication creams can weaken latex condoms.   HOME CARE:  Good hygiene may prevent some types of vaginosis from recurring and may relieve some symptoms:  . Avoid baths, hot tubs and whirlpool spas. Rinse soap from your outer genital area after a shower, and dry the area well to prevent irritation. Don't use scented or harsh soaps, such as those with deodorant or antibacterial action. Marland Kitchen Avoid irritants. These include scented tampons and pads. . Wipe from front to back after using the toilet. Doing so avoids spreading fecal bacteria to your vagina.  Other things that may help prevent vaginosis include:  Marland Kitchen Don't douche. Your vagina doesn't require cleansing other than normal bathing. Repetitive douching disrupts the normal organisms that reside in the vagina and can actually increase your risk of vaginal infection. Douching won't clear up a vaginal infection. . Use a latex condom. Both female and female latex condoms may help you avoid infections spread by sexual contact. . Wear cotton underwear. Also wear pantyhose with a cotton crotch. If you feel comfortable without it, skip wearing underwear to bed. Yeast thrives in Marland Kitchen Your symptoms should improve in the next day or two.  GET HELP RIGHT AWAY IF:  . You have pain in your lower abdomen ( pelvic area or over your ovaries) . You  develop nausea or vomiting . You develop a fever . Your discharge changes or worsens . You have persistent pain with intercourse . You develop shortness of breath, a rapid pulse, or you faint.  These symptoms could be signs of problems or infections that need to be evaluated by a medical provider now.  MAKE SURE YOU     Understand these instructions.  Will watch your condition.  Will get help right away if you are not doing well or get worse.  Your e-visit answers were reviewed by a board certified advanced clinical practitioner to complete your personal care plan. Depending upon the condition, your plan could have included both over the counter or prescription medications. Please review your pharmacy choice to make sure that you have choses a pharmacy that is open for you to pick up any needed prescription, Your safety is important to [place]. If you have drug allergies check your prescription carefully.   You can use MyChart to ask questions about today's visit, request a non-urgent call back, or ask for a work or school excuse for 24 hours related to this e-Visit. If it has been greater than 24 hours you will need to follow up with your provider, or enter a new e-Visit to address those concerns. You will get a MyChart message within the next two days asking about your experience. I hope that your e-visit has been valuable and will speed your recovery.

## 2020-05-09 ENCOUNTER — Ambulatory Visit: Payer: 59 | Admitting: Internal Medicine

## 2020-05-09 NOTE — Progress Notes (Deleted)
Name: Tiffany Saunders  MRN/ DOB: [id number], [birth year]   Age/ Sex: 24 y.o., female    PCP: Christen Butter, NP   Reason for Endocrinology Evaluation: Type 1 Diabetes Mellitus     Date of Initial Endocrinology Visit: 05/09/2020     PATIENT IDENTIFIER: Tiffany Saunders is a 23 y.o. female with a past medical history of T1DM . The patient presented for initial endocrinology clinic visit on 05/09/2020 for consultative assistance with her diabetes management.    HPI: Ms. Mahadeo was    Diagnosed with DM *** Prior Medications tried/Intolerance: *** Currently checking blood sugars *** x / day,  before breakfast and ***.  Hypoglycemia episodes : ***               Symptoms: ***                 Frequency: ***/  Hemoglobin A1c has ranged from 8.9% in 2019, peaking at 11.0 % in 2018. Patient required assistance for hypoglycemia:  Patient has required hospitalization within the last 1 year from hyper or hypoglycemia:   In terms of diet, the patient ***   Pt has been following with [facility] endocrinology. Last visit 11/2019. Dexcom was prescribed   HOME DIABETES REGIMEN: Basal: ***  Bolus: ***   Statin: {Yes/No:11203} ACE-I/ARB: {YES/NO:17245} Prior Diabetic Education: {Yes/No:11203}   METER DOWNLOAD SUMMARY: Date range evaluated: *** Fingerstick Blood Glucose Tests = *** Average Number Tests/Day = *** Overall Mean FS Glucose = *** Standard Deviation = ***  BG Ranges: Low = *** High = ***   Hypoglycemic Events/30 Days: BG < 50 = *** Episodes of symptomatic severe hypoglycemia = ***   DIABETIC COMPLICATIONS: Microvascular complications:   ***  Denies: ***  Last eye exam: Completed 11/30/2019  Macrovascular complications:   ***  Denies: CAD, PVD, CVA   PAST HISTORY: Past Medical History:  Past Medical History:  Diagnosis Date   Diabetes mellitus without complication (HCC)     Past Surgical History:  Past Surgical History:  Procedure Laterality Date   CESAREAN SECTION         Social History:  reports that she has never smoked. She has never used smokeless tobacco. She reports current alcohol use. She reports current drug use. Drug: Marijuana. Family History:  Family History  Problem Relation Age of Onset   Hypertension Mother    Diabetes Maternal Grandmother    Hypertension Paternal Grandmother       HOME MEDICATIONS: Allergies as of 05/09/2020   No Known Allergies     Medication List       Accurate as of May 09, 2020  8:08 AM. If you have any questions, ask your nurse or doctor.        fluconazole 150 MG tablet Commonly known as: DIFLUCAN Take 1 tablet (150 mg total) by mouth 2 (two) times daily as needed for up to 2 doses. Take one tablet on day 1. Take another table after 72 hours if symptoms have not resolved.   HumaLOG KwikPen 100 UNIT/ML KwikPen Generic drug: insulin lispro AS DIRECTED ON A SLIDING SCALE   metroNIDAZOLE 500 MG tablet Commonly known as: Flagyl Take 1 tablet (500 mg total) by mouth 2 (two) times daily with a meal. DO NOT CONSUME ALCOHOL WHILE TAKING THIS MEDICATION.   Sprintec 28 0.25-35 MG-MCG tablet Generic drug: norgestimate-ethinyl estradiol Take 1 tablet by mouth daily.   Evaristo Bury FlexTouch 100 UNIT/ML FlexTouch Pen Generic drug: insulin degludec Inject 20 Units into the skin  at bedtime.        ALLERGIES: No Known Allergies   REVIEW OF SYSTEMS: A comprehensive ROS was conducted with the patient and is negative except as per HPI and below:  ROS    OBJECTIVE:   VITAL SIGNS: There were no vitals taken for this visit.   PHYSICAL EXAM:  General: Pt appears well and is in NAD  Hydration: Well-hydrated with moist mucous membranes and good skin turgor  HEENT: Head: Unremarkable with good dentition. Oropharynx clear without exudate.  Eyes: External eye exam normal without stare, lid lag or exophthalmos.  EOM intact.  PERRL.  Neck: General: Supple without adenopathy or carotid bruits. Thyroid:  Thyroid size normal.  No goiter or nodules appreciated. No thyroid bruit.  Lungs: Clear with good BS bilat with no rales, rhonchi, or wheezes  Heart: RRR with normal S1 and S2 and no gallops; no murmurs; no rub  Abdomen: Normoactive bowel sounds, soft, nontender, without masses or organomegaly palpable  Extremities:  Lower extremities - No pretibial edema. No lesions.  Skin: Normal texture and temperature to palpation. No rash noted. No Acanthosis nigricans/skin tags. No lipohypertrophy.  Neuro: MS is good with appropriate affect, pt is alert and Ox3    DM foot exam:    DATA REVIEWED:  Lab Results  Component Value Date   HGBA1C 10.4 (A) 01/28/2020   No results found for: GLUF, MICROALBUR, LDLCALC, CREATININE No results found for: MICRALBCREAT  No results found for: CHOL, HDL, LDLCALC, LDLDIRECT, TRIG, CHOLHDL      ASSESSMENT / PLAN / RECOMMENDATIONS:   1) Type *** Diabetes Mellitus, ***controlled, With*** complications - Most recent A1c of *** %. Goal A1c < *** %.  ***  Plan: GENERAL:  ***  MEDICATIONS:  ***  EDUCATION / INSTRUCTIONS:  BG monitoring instructions: Patient is instructed to check her blood sugars *** times a day, ***.  Call [facility] if: BG persistently < 70 or > 300.  I reviewed the Rule of 15 for the treatment of hypoglycemia in detail with the patient. Literature supplied.   2) Diabetic complications:   Eye: Does *** have known diabetic retinopathy.   Neuro/ Feet: Does *** have known diabetic peripheral neuropathy.  Renal: Patient does *** have known baseline CKD. She is *** on an ACEI/ARB at present.Check urine albumin/creatinine ratio yearly starting at time of diagnosis. If albuminuria is positive, treatment is geared toward better glucose, blood pressure control and use of ACE inhibitors or ARBs. Monitor electrolytes and creatinine once to twice yearly.   3) Lipids: Patient is *** on a statin.    4) Hypertension: ***   at goal of < 140/90 mmHg.       Signed electronically by: Lyndle Herrlich, MD  [facility]  [facility] [address] [place] [place], [place] [address] Phone: [phone] FAX: [phone]   CC: Christen Butter, NP [address] [place] [address] Phone: [phone]  Fax: [phone]    Return to Endocrinology clinic as below: Future Appointments  Date Time Provider Department Center  05/09/2020  2:40 PM Hilbert Briggs, Konrad Dolores, MD [id number] None

## 2020-05-27 ENCOUNTER — Telehealth: Payer: 59 | Admitting: Physician Assistant

## 2020-05-27 DIAGNOSIS — N760 Acute vaginitis: Secondary | ICD-10-CM

## 2020-05-27 MED ORDER — FLUCONAZOLE 150 MG PO TABS: 150.0000 mg | ORAL_TABLET | Freq: Once | ORAL | 0 refills | Status: AC

## 2020-05-27 MED FILL — FLUCONAZOLE 150 MG TABS: 150 | 2 days supply | Qty: 2 | Fill #0

## 2020-05-27 MED FILL — FEMYNOR 0.25-35 MG-MCG TABS: 0.25-35 | 84 days supply | Qty: 84 | Fill #1

## 2020-05-27 NOTE — Progress Notes (Signed)
Hi Conna,  I am sorry you are not feeling well.  I see this is your 6th E-visit for vaginitis.  Please schedule an appointment with your PCP next month to follow-up on this problem.  You may need to have some lab work or a physical exam.  Based on what you shared with me it looks like you: May have a yeast vaginosis  Vaginosis is an inflammation of the vagina that can result in discharge, itching and pain. The cause is usually a change in the normal balance of vaginal bacteria or an infection. Vaginosis can also result from reduced estrogen levels after menopause.  The most common causes of vaginosis are:   Bacterial vaginosis which results from an overgrowth of one on several organisms that are normally present in your vagina.   Yeast infections which are caused by a naturally occurring fungus called candida.   Vaginal atrophy (atrophic vaginosis) which results from the thinning of the vagina from reduced estrogen levels after menopause.   Trichomoniasis which is caused by a parasite and is commonly transmitted by sexual intercourse.  Factors that increase your risk of developing vaginosis include: Marland Kitchen Medications, such as antibiotics and steroids . Uncontrolled diabetes . Use of hygiene products such as bubble bath, vaginal spray or vaginal deodorant . Douching . Wearing damp or tight-fitting clothing . Using an intrauterine device (IUD) for birth control . Hormonal changes, such as those associated with pregnancy, birth control pills or menopause . Sexual activity . Having a sexually transmitted infection  Your treatment plan is A single Diflucan (fluconazole) 150mg  tablet once.  I have electronically sent this prescription into the pharmacy that you have chosen.  Be sure to take all of the medication as directed. Stop taking any medication if you develop a rash, tongue swelling or shortness of breath. Mothers who are breast feeding should consider pumping and discarding their breast  milk while on these antibiotics. However, there is no consensus that infant exposure at these doses would be harmful.  Remember that medication creams can weaken latex condoms.   HOME CARE:  Good hygiene may prevent some types of vaginosis from recurring and may relieve some symptoms:  . Avoid baths, hot tubs and whirlpool spas. Rinse soap from your outer genital area after a shower, and dry the area well to prevent irritation. Don't use scented or harsh soaps, such as those with deodorant or antibacterial action. Marland Kitchen Avoid irritants. These include scented tampons and pads. . Wipe from front to back after using the toilet. Doing so avoids spreading fecal bacteria to your vagina.  Other things that may help prevent vaginosis include:  Marland Kitchen Don't douche. Your vagina doesn't require cleansing other than normal bathing. Repetitive douching disrupts the normal organisms that reside in the vagina and can actually increase your risk of vaginal infection. Douching won't clear up a vaginal infection. . Use a latex condom. Both female and female latex condoms may help you avoid infections spread by sexual contact. . Wear cotton underwear. Also wear pantyhose with a cotton crotch. If you feel comfortable without it, skip wearing underwear to bed. Yeast thrives in [place] Kitchen Your symptoms should improve in the next day or two.  GET HELP RIGHT AWAY IF:  . You have pain in your lower abdomen ( pelvic area or over your ovaries) . You develop nausea or vomiting . You develop a fever . Your discharge changes or worsens . You have persistent pain with intercourse . You develop shortness  of breath, a rapid pulse, or you faint.  These symptoms could be signs of problems or infections that need to be evaluated by a medical provider now.  MAKE SURE YOU    Understand these instructions.  Will watch your condition.  Will get help right away if you are not doing well or get worse.  Your e-visit  answers were reviewed by a board certified advanced clinical practitioner to complete your personal care plan. Depending upon the condition, your plan could have included both over the counter or prescription medications. Please review your pharmacy choice to make sure that you have choses a pharmacy that is open for you to pick up any needed prescription, Your safety is important to [place]. If you have drug allergies check your prescription carefully.   You can use MyChart to ask questions about today's visit, request a non-urgent call back, or ask for a work or school excuse for 24 hours related to this e-Visit. If it has been greater than 24 hours you will need to follow up with your provider, or enter a new e-Visit to address those concerns. You will get a MyChart message within the next two days asking about your experience. I hope that your e-visit has been valuable and will speed your recovery.  Greater than 5 minutes, yet less than 10 minutes of time have been spent researching, coordinating and implementing care for this patient today.

## 2020-08-04 DIAGNOSIS — L299 Pruritus, unspecified: Secondary | ICD-10-CM | POA: Diagnosis not present

## 2020-12-13 ENCOUNTER — Telehealth: Payer: Self-pay

## 2020-12-13 NOTE — Telephone Encounter (Signed)
LVMTRC (1st attempt)    Pt called and stated that she has not been able to locate an Endo and would like for [facility] to refill her insulins and pen needles. Pt's last OV was 01/28/2020 and pt was instructed to f/up with her Endo at that time. Looking further in to her chart, she has not seen Endo since 11/26/2019. Pt will need to come in for an OV for further evaluation prior to [facility] writing these Rx's.

## 2020-12-13 NOTE — Telephone Encounter (Signed)
Pt is scheduled for an OV on 12/20/2020. Pt aware medications will be discussed during that visit. No further questions or concerns at this time.

## 2020-12-19 NOTE — Progress Notes (Signed)
HPI: Tiffany Saunders is a 25 y.o. female who  has a past medical history of Diabetes mellitus without complication ([place]).  she presents to [facility] today, 12/20/20,  for chief complaint of: Annual physical exam  Dentist: goes twice a year, due for visit Eye exam: 2020, wears glasses, no concerns Exercise: walking dogs every day Diet: well-balanced Pap smear: UTD, due next year COVID vaccine: Done  Concerns: Needs glucometer and referral to Endocrinology with [facility]  Past medical, surgical, social and family history reviewed:  There are no problems to display for this patient.   Past Surgical History:  Procedure Laterality Date  . CESAREAN SECTION      Social History   Tobacco Use  . Smoking status: Never Smoker  . Smokeless tobacco: Never Used  Substance Use Topics  . Alcohol use: Yes    Comment: 1-2 glasses of wine/month    Family History  Problem Relation Age of Onset  . Hypertension Mother   . Diabetes Maternal Grandmother   . Hypertension Paternal Grandmother      Current medication list and allergy/intolerance information reviewed:    Current Outpatient Medications  Medication Sig Dispense Refill  . blood glucose meter kit and supplies KIT Dispense based on patient and insurance preference. Use up to four times daily as directed. Please include lancets, test strips, control solution. 1 each 0  . fluconazole (DIFLUCAN) 150 MG tablet Take 1 tablet (150 mg total) by mouth 2 (two) times daily as needed for up to 2 doses. Take one tablet on day 1. Take another table after 72 hours if symptoms have not resolved. 2 tablet 0  . metroNIDAZOLE (FLAGYL) 500 MG tablet Take 1 tablet (500 mg total) by mouth 2 (two) times daily with a meal. DO NOT CONSUME ALCOHOL WHILE TAKING THIS MEDICATION. 14 tablet 0  . NOVOLOG FLEXPEN 100 UNIT/ML FlexPen PLEASE SEE ATTACHED FOR DETAILED DIRECTIONS 15 mL 3  . SPRINTEC 28 0.25-35 MG-MCG tablet Take 1  tablet by mouth daily. 84 tablet 3  . TRESIBA FLEXTOUCH 100 UNIT/ML FlexTouch Pen Inject 20 Units into the skin at bedtime. 18 mL 3   No current facility-administered medications for this visit.    No Known Allergies    Review of Systems:  Constitutional:  No  fever, no chills, No recent illness, No unintentional weight changes. No significant fatigue.   HEENT: No  headache, no vision change, no hearing change, No sore throat, No  sinus pressure  Cardiac: No  chest pain, No  pressure, No palpitations, No  Orthopnea  Respiratory:  No  shortness of breath. No  Cough  Gastrointestinal: No  abdominal pain, No  nausea, No  vomiting,  No  blood in stool, No  diarrhea, No  constipation   Musculoskeletal: No new myalgia/arthralgia  Skin: No  Rash, No other wounds/concerning lesions  Genitourinary: No  incontinence, No  abnormal genital bleeding, No abnormal genital discharge  Hem/Onc: No  easy bruising/bleeding, No  abnormal lymph node  Endocrine: No cold intolerance,  No heat intolerance. No polyuria/polydipsia/polyphagia   Neurologic: No  weakness, No  dizziness, No  slurred speech/focal weakness/facial droop  Psychiatric: No  concerns with depression, No  concerns with anxiety, No sleep problems, No mood problems  Exam:  BP 108/69   Pulse 74   Temp 98.3 F (36.8 C)   Ht _0  (1.549 m)   Wt 140 lb 11.2 oz (63.8 kg)   SpO2 99%   BMI  26.59 kg/m   Constitutional: VS see above. General Appearance: alert, well-developed, well-nourished, NAD  Eyes: Normal lids and conjunctive, non-icteric sclera  Ears, Nose, Mouth, Throat: MMM, Normal external inspection ears/nares/mouth/lips/gums. TM normal bilaterally.   Neck: No masses, trachea midline. No thyroid enlargement. No tenderness/mass appreciated. No lymphadenopathy  Respiratory: Normal respiratory effort. no wheeze, no rhonchi, no rales  Cardiovascular: S1/S2 normal, no murmur, no rub/gallop auscultated. RRR. No lower  extremity edema. Pedal pulse II/IV bilaterally PT. No carotid bruit or JVD. No abdominal aortic bruit.  Gastrointestinal: Nontender, no masses. No hepatomegaly, no splenomegaly. No hernia appreciated. Bowel sounds normal. Rectal exam deferred.   Musculoskeletal: Gait normal. No clubbing/cyanosis of digits.   Neurological: Normal balance/coordination. No tremor. No cranial nerve deficit on limited exam. Motor and sensation intact and symmetric. Cerebellar reflexes intact.   Skin: warm, dry, intact. No rash/ulcer. No concerning nevi or subq nodules on limited exam.    Psychiatric: Normal judgment/insight. Normal mood and affect. Oriented x3.    No results found for this or any previous visit (from the past 72 hour(s)).  No results found.   ASSESSMENT/PLAN:   1. Annual physical exam Checking labs. UTD on preventative care.  - CBC with Differential/Platelet - COMPLETE METABOLIC PANEL WITH GFR - Lipid panel  2. Type 1 diabetes mellitus without complications (HCC) Checking Hemoglobin A1c. Referring to endocrinology. Refilling medications today.  - Hemoglobin A1c - Ambulatory referral to Endocrinology  3. Visit for birth control pills maintenance Refilling birth control.  - SPRINTEC 28 0.25-35 MG-MCG tablet; Take 1 tablet by mouth daily.  Dispense: 84 tablet; Refill: 3  4. Screening for endocrine disorder Checking TSH.  - TSH  Orders Placed This Encounter  Procedures  . CBC with Differential/Platelet  . COMPLETE METABOLIC PANEL WITH GFR  . Lipid panel  . TSH  . Hemoglobin A1c  . Ambulatory referral to Endocrinology    Meds ordered this encounter  Medications  . blood glucose meter kit and supplies KIT    Sig: Dispense based on patient and insurance preference. Use up to four times daily as directed. Please include lancets, test strips, control solution.    Dispense:  1 each    Refill:  0    Order Specific Question:   Supervising Provider    Answer:   Emeterio Reeve [[id number]]    Order Specific Question:   Number of strips    Answer:   200    Order Specific Question:   Number of lancets    Answer:   200  . SPRINTEC 28 0.25-35 MG-MCG tablet    Sig: Take 1 tablet by mouth daily.    Dispense:  84 tablet    Refill:  3    Order Specific Question:   Supervising Provider    Answer:   Emeterio Reeve [id number]  . TRESIBA FLEXTOUCH 100 UNIT/ML FlexTouch Pen    Sig: Inject 20 Units into the skin at bedtime.    Dispense:  18 mL    Refill:  3    Order Specific Question:   Supervising Provider    Answer:   Emeterio Reeve [id number]  . NOVOLOG FLEXPEN 100 UNIT/ML FlexPen    Sig: PLEASE SEE ATTACHED FOR DETAILED DIRECTIONS    Dispense:  15 mL    Refill:  3    Order Specific Question:   Supervising Provider    Answer:   Emeterio Reeve [[id number]]    There are no Patient Instructions on file for this visit.  Follow-up plan: Return in about 1 year (around 12/20/2021) for annual physical exam; 3 months if not in with endocrinology yet.  Clearnce Sorrel, DNP, APRN, FNP-BC [facility]

## 2020-12-20 ENCOUNTER — Other Ambulatory Visit: Payer: Self-pay

## 2020-12-20 ENCOUNTER — Encounter: Payer: Self-pay | Admitting: Medical-Surgical

## 2020-12-20 ENCOUNTER — Ambulatory Visit (INDEPENDENT_AMBULATORY_CARE_PROVIDER_SITE_OTHER): Payer: 59 | Admitting: Medical-Surgical

## 2020-12-20 VITALS — BP 108/69 | HR 74 | Temp 98.3°F | Ht 61.0 in | Wt 140.7 lb

## 2020-12-20 DIAGNOSIS — Z1329 Encounter for screening for other suspected endocrine disorder: Secondary | ICD-10-CM | POA: Diagnosis not present

## 2020-12-20 DIAGNOSIS — E109 Type 1 diabetes mellitus without complications: Secondary | ICD-10-CM | POA: Diagnosis not present

## 2020-12-20 DIAGNOSIS — Z124 Encounter for screening for malignant neoplasm of cervix: Secondary | ICD-10-CM

## 2020-12-20 DIAGNOSIS — Z3041 Encounter for surveillance of contraceptive pills: Secondary | ICD-10-CM

## 2020-12-20 DIAGNOSIS — Z0000 Encounter for general adult medical examination without abnormal findings: Secondary | ICD-10-CM

## 2020-12-20 LAB — CBC WITH DIFFERENTIAL/PLATELET
Absolute Monocytes: 447 cells/uL (ref 200–950)
Neutrophils Relative %: 65.5 %
Platelets: 241 10*3/uL (ref 140–400)

## 2020-12-20 LAB — COMPLETE METABOLIC PANEL WITH GFR
AG Ratio: 1.1 (calc) (ref 1.0–2.5)
ALT: 14 U/L (ref 6–29)
GFR, Est Non African American: 111 mL/min/{1.73_m2} (ref >=60)
Globulin: 3.6 g/dL (calc) (ref 1.9–3.7)

## 2020-12-20 LAB — LIPID PANEL
Cholesterol: 217 mg/dL — ABNORMAL HIGH (ref <200)
Non-HDL Cholesterol (Calc): 141 mg/dL (calc) — ABNORMAL HIGH (ref <130)

## 2020-12-20 MED ORDER — BLOOD GLUCOSE MONITOR KIT: PACK | 0 refills | Status: AC

## 2020-12-20 MED ORDER — SPRINTEC 28 0.25-35 MG-MCG PO TABS: 1.0000 | ORAL_TABLET | Freq: Every day | ORAL | 3 refills | Status: DC

## 2020-12-20 MED ORDER — NOVOLOG FLEXPEN 100 UNIT/ML ~~LOC~~ SOPN: PEN_INJECTOR | SUBCUTANEOUS | 3 refills | Status: DC

## 2020-12-20 MED ORDER — TRESIBA FLEXTOUCH 100 UNIT/ML ~~LOC~~ SOPN: 20.0000 [IU] | PEN_INJECTOR | Freq: Every day | SUBCUTANEOUS | 3 refills | Status: DC

## 2020-12-21 LAB — CBC WITH DIFFERENTIAL/PLATELET
Basophils Absolute: 69 cells/uL (ref 0–200)
Basophils Relative: 0.8 %
Eosinophils Absolute: 77 cells/uL (ref 15–500)
Eosinophils Relative: 0.9 %
HCT: 43.1 % (ref 35.0–45.0)
Hemoglobin: 14.3 g/dL (ref 11.7–15.5)
Lymphs Abs: 2374 cells/uL (ref 850–3900)
MCH: 30.7 pg (ref 27.0–33.0)
MCHC: 33.2 g/dL (ref 32.0–36.0)
MCV: 92.5 fL (ref 80.0–100.0)
MPV: 11.5 fL (ref 7.5–12.5)
Monocytes Relative: 5.2 %
Neutro Abs: 5633 cells/uL (ref 1500–7800)
RBC: 4.66 10*6/uL (ref 3.80–5.10)
RDW: 12.8 % (ref 11.0–15.0)
Total Lymphocyte: 27.6 %
WBC: 8.6 10*3/uL (ref 3.8–10.8)

## 2020-12-21 LAB — COMPLETE METABOLIC PANEL WITH GFR
AST: 18 U/L (ref 10–30)
Albumin: 4 g/dL (ref 3.6–5.1)
Alkaline phosphatase (APISO): 56 U/L (ref 31–125)
BUN: 12 mg/dL (ref 7–25)
CO2: 26 mmol/L (ref 20–32)
Calcium: 9.3 mg/dL (ref 8.6–10.2)
Chloride: 100 mmol/L (ref 98–110)
Creat: 0.75 mg/dL (ref 0.50–1.10)
GFR, Est African American: 128 mL/min/{1.73_m2} (ref >=60)
Glucose, Bld: 274 mg/dL — ABNORMAL HIGH (ref 65–99)
Potassium: 4.3 mmol/L (ref 3.5–5.3)
Sodium: 135 mmol/L (ref 135–146)
Total Bilirubin: 0.4 mg/dL (ref 0.2–1.2)
Total Protein: 7.6 g/dL (ref 6.1–8.1)

## 2020-12-21 LAB — LIPID PANEL
HDL: 76 mg/dL (ref >=50)
LDL Cholesterol (Calc): 114 mg/dL (calc) — ABNORMAL HIGH
Total CHOL/HDL Ratio: 2.9 (calc) (ref <5.0)
Triglycerides: 153 mg/dL — ABNORMAL HIGH (ref <150)

## 2020-12-21 LAB — HEMOGLOBIN A1C
Hgb A1c MFr Bld: 12.1 % of total Hgb — ABNORMAL HIGH (ref <5.7)
Mean Plasma Glucose: 301 mg/dL
eAG (mmol/L): 16.6 mmol/L

## 2020-12-21 LAB — TSH: TSH: 1.53 mIU/L

## 2020-12-23 ENCOUNTER — Other Ambulatory Visit: Payer: Self-pay

## 2020-12-23 DIAGNOSIS — E109 Type 1 diabetes mellitus without complications: Secondary | ICD-10-CM

## 2020-12-23 MED ORDER — INSULIN PEN NEEDLE 31G X 8 MM MISC: 3 refills | Status: AC

## 2020-12-29 ENCOUNTER — Telehealth: Payer: Self-pay

## 2020-12-29 NOTE — Telephone Encounter (Signed)
Can you verify which insulin was the issue? She has Novolog and [place] ordered. Also check to see what the cost of it is please.  Thanks,  Ander Slade

## 2020-12-29 NOTE — Telephone Encounter (Signed)
Pt called and stated she cannot afford the insulin that was prescribed. Pt requests an alternative.

## 2021-01-02 ENCOUNTER — Telehealth: Payer: Self-pay

## 2021-01-02 MED ORDER — TOUJEO MAX SOLOSTAR 300 UNIT/ML ~~LOC~~ SOPN: 20.0000 [IU] | PEN_INJECTOR | Freq: Every day | SUBCUTANEOUS | 11 refills | Status: DC

## 2021-01-02 NOTE — Telephone Encounter (Signed)
Called pt, pt sounded well. We discussed which insulin she needs changed due to cost. Pt states she needs the [place] changed to a tier 1.

## 2021-01-02 NOTE — Telephone Encounter (Signed)
LVMTRC (1st attempt)   Pt called and LVM asking for a return call. Pt sounded very upset on the voicemail but did not state anything that may be going on.

## 2021-01-02 NOTE — Telephone Encounter (Signed)
Pt states it is the [place] she needs changed. She states the cost was $78.  Thanks

## 2021-01-02 NOTE — Telephone Encounter (Signed)
Pt aware of info below. No further questions or concerns at this time.

## 2021-01-02 NOTE — Telephone Encounter (Signed)
See telephone encounter dated 12/29/2020

## 2021-01-02 NOTE — Telephone Encounter (Signed)
Switching to toujeo max with instructions to take 20 units nightly. New prescription sent to the pharmacy. We have a discount card available that she can pick up so it can be as cheap as possible.

## 2021-05-24 ENCOUNTER — Other Ambulatory Visit: Payer: Self-pay

## 2021-05-24 DIAGNOSIS — N760 Acute vaginitis: Secondary | ICD-10-CM

## 2021-05-24 MED ORDER — FLUCONAZOLE 150 MG PO TABS: 150.0000 mg | ORAL_TABLET | Freq: Two times a day (BID) | ORAL | 0 refills | Status: DC | PRN

## 2021-09-24 ENCOUNTER — Other Ambulatory Visit: Payer: Self-pay | Admitting: Medical-Surgical

## 2021-10-20 ENCOUNTER — Other Ambulatory Visit: Payer: Self-pay | Admitting: Medical-Surgical

## 2021-10-20 ENCOUNTER — Other Ambulatory Visit: Payer: Self-pay

## 2021-10-20 DIAGNOSIS — E109 Type 1 diabetes mellitus without complications: Secondary | ICD-10-CM

## 2021-10-21 ENCOUNTER — Telehealth: Payer: Self-pay | Admitting: Sports Medicine

## 2021-10-21 DIAGNOSIS — E1069 Type 1 diabetes mellitus with other specified complication: Secondary | ICD-10-CM | POA: Insufficient documentation

## 2021-10-21 MED ORDER — INSULIN LISPRO (1 UNIT DIAL) 100 UNIT/ML (KWIKPEN): 1.0000 [IU] | PEN_INJECTOR | Freq: Three times a day (TID) | SUBCUTANEOUS | 11 refills | Status: AC

## 2021-10-21 NOTE — Telephone Encounter (Signed)
26 year old female calls the on-call line, she has run out of her NovoLog she would like Humalog called in, called in the same dosage regimen, Humalog 1 to 8 units 15 minutes before meals, she will need to follow-up with her PCP ASAP regarding her new insulin regimen.  Of note CBGs in the 230s prior to call, she is having some polydipsia and polyuria.

## 2021-11-08 ENCOUNTER — Other Ambulatory Visit: Payer: Self-pay

## 2021-11-08 ENCOUNTER — Telehealth: Payer: Self-pay

## 2021-11-08 DIAGNOSIS — Z1329 Encounter for screening for other suspected endocrine disorder: Secondary | ICD-10-CM

## 2021-11-08 NOTE — Telephone Encounter (Signed)
Pt requested an alternative to Toujeo. Pt states this insulin is too expensive. Thanks

## 2021-11-22 ENCOUNTER — Telehealth: Payer: Self-pay | Admitting: General Practice

## 2021-11-22 NOTE — Telephone Encounter (Signed)
Transition Care Management Follow-up Telephone Call Date of discharge and from where: 11/20/21 from [facility] How have you been since you were released from the hospital? Doing better.  Any questions or concerns? No  Items Reviewed: Did the pt receive and understand the discharge instructions provided? Yes  Medications obtained and verified? No  Other? No  Any new allergies since your discharge? No  Dietary orders reviewed? Yes Do you have support at home? Yes   Home Care and Equipment/Supplies: Were home health services ordered? no  Functional Questionnaire: (I = Independent and D = Dependent) ADLs: I  Bathing/Dressing- I  Meal Prep- I  Eating- I  Maintaining continence- I  Transferring/Ambulation- I  Managing Meds- I  Follow up appointments reviewed:  PCP Hospital f/u appt confirmed? Yes  Scheduled to see Christen Butter, NP on 01/01/22 @ 120. Specialist Hospital f/u appt confirmed? Yes  Scheduled to see Endocrinologist on 11/22/21. Are transportation arrangements needed? No  If their condition worsens, is the pt aware to call PCP or go to the Emergency Dept.? Yes Was the patient provided with contact information for the PCP's office or ED? Yes Was to pt encouraged to call back with questions or concerns? Yes

## 2021-12-25 ENCOUNTER — Other Ambulatory Visit: Payer: Self-pay | Admitting: Medical-Surgical

## 2021-12-25 DIAGNOSIS — Z3041 Encounter for surveillance of contraceptive pills: Secondary | ICD-10-CM

## 2022-01-01 ENCOUNTER — Ambulatory Visit (INDEPENDENT_AMBULATORY_CARE_PROVIDER_SITE_OTHER): Payer: Commercial Managed Care - PPO | Admitting: Medical-Surgical

## 2022-01-01 ENCOUNTER — Encounter: Payer: Self-pay | Admitting: Medical-Surgical

## 2022-01-01 ENCOUNTER — Other Ambulatory Visit (HOSPITAL_COMMUNITY)
Admission: RE | Admit: 2022-01-01 | Discharge: 2022-01-01 | Disposition: A | Discharge status: Home / Self Care | Payer: Commercial Managed Care - PPO | Source: Ambulatory Visit | Attending: Medical-Surgical | Admitting: Medical-Surgical

## 2022-01-01 ENCOUNTER — Other Ambulatory Visit: Payer: Self-pay

## 2022-01-01 VITALS — BP 151/83 | HR 103 | Resp 20 | Ht 61.0 in | Wt 145.0 lb

## 2022-01-01 DIAGNOSIS — E1069 Type 1 diabetes mellitus with other specified complication: Secondary | ICD-10-CM | POA: Diagnosis not present

## 2022-01-01 DIAGNOSIS — Z0000 Encounter for general adult medical examination without abnormal findings: Secondary | ICD-10-CM

## 2022-01-01 DIAGNOSIS — Z01419 Encounter for gynecological examination (general) (routine) without abnormal findings: Secondary | ICD-10-CM

## 2022-01-01 DIAGNOSIS — Z23 Encounter for immunization: Secondary | ICD-10-CM | POA: Diagnosis not present

## 2022-01-01 LAB — POCT UA - MICROALBUMIN
Albumin/Creatinine Ratio, Urine, POC: <30
Creatinine, POC: 50 mg/dL
Microalbumin Ur, POC: 10 mg/L

## 2022-01-01 NOTE — Progress Notes (Signed)
HPI: ?Tiffany Saunders is a 26 y.o. female who  has a past medical history of Diabetes mellitus without complication ([place]). ? ?she presents to [facility] today, 01/01/22,  ?for chief complaint of: ?Annual physical exam ? ?Dentist: UTD ?Eye exam: UTD ?Exercise: active at work ?Diet: carb counting ?Pap smear: updating today ?COVID vaccine: none ? ?Concerns: ?None ? ?Past medical, surgical, social and family history reviewed: ? ?Patient Active Problem List  ? Diagnosis Date Noted  ? Type 1 diabetes mellitus with other specified complication ([place]) 56/38/9373  ? ? ?Past Surgical History:  ?Procedure Laterality Date  ? CESAREAN SECTION    ? ? ?Social History  ? ?Tobacco Use  ? Smoking status: Never  ? Smokeless tobacco: Never  ?Substance Use Topics  ? Alcohol use: Yes  ?  Comment: 1-2 glasses of wine/month  ? ? ?Family History  ?Problem Relation Age of Onset  ? Hypertension Mother   ? Diabetes Maternal Grandmother   ? Hypertension Paternal Grandmother   ?  ? ?Current medication list and allergy/intolerance information reviewed:   ? ?Current Outpatient Medications  ?Medication Sig Dispense Refill  ? Continuous Blood Gluc Sensor (DEXCOM G6 SENSOR) MISC by Does not apply route.    ? Continuous Blood Gluc Transmit (DEXCOM G6 TRANSMITTER) MISC by Does not apply route.    ? blood glucose meter kit and supplies KIT Dispense based on patient and insurance preference. Use up to four times daily as directed. Please include lancets, test strips, control solution. 1 each 0  ? Continuous Blood Gluc Sensor (DEXCOM G6 SENSOR) MISC Apply topically as directed.    ? fluconazole (DIFLUCAN) 150 MG tablet Take 1 tablet (150 mg total) by mouth 2 (two) times daily as needed for up to 2 doses. Take one tablet on day 1. Take another table after 72 hours if symptoms have not resolved. 2 tablet 0  ? Insulin Glargine (BASAGLAR KWIKPEN) 100 UNIT/ML SMARTSIG:16 Unit(s) SUB-Q Daily    ? insulin lispro (HUMALOG KWIKPEN)  100 UNIT/ML KwikPen Inject 1-8 Units into the skin 3 (three) times daily before meals. 15 mL 11  ? Insulin Pen Needle 31G X 8 MM MISC USE 1 PEN NEEDLE FOUR TIMES DAILY FOR INSULIN INJECTIONS 300 each 3  ? metroNIDAZOLE (FLAGYL) 500 MG tablet Take 1 tablet (500 mg total) by mouth 2 (two) times daily with a meal. DO NOT CONSUME ALCOHOL WHILE TAKING THIS MEDICATION. 14 tablet 0  ? norgestimate-ethinyl estradiol (SPRINTEC 28) 0.25-35 MG-MCG tablet TAKE 1 TABLET BY MOUTH EVERY DAY 84 tablet 0  ? ?No current facility-administered medications for this visit.  ? ? ?Not on File ?  ? ?Review of Systems: ?Constitutional:  No  fever, no chills, No recent illness, No unintentional weight changes. No significant fatigue.  ?HEENT: No  headache, no vision change, no hearing change, No sore throat, No  sinus pressure ?Cardiac: No  chest pain, No  pressure, No palpitations, No  Orthopnea ?Respiratory:  No  shortness of breath. No  Cough ?Gastrointestinal: No  abdominal pain, No  nausea, No  vomiting,  No  blood in stool, No  diarrhea, No  constipation  ?Musculoskeletal: No new myalgia/arthralgia ?Skin: No  Rash, No other wounds/concerning lesions ?Genitourinary: No  incontinence, No  abnormal genital bleeding, No abnormal genital discharge ?Hem/Onc: No  easy bruising/bleeding, No  abnormal lymph node ?Endocrine: No cold intolerance,  No heat intolerance. No polyuria/polydipsia/polyphagia  ?Neurologic: No  weakness, No  dizziness, No  slurred  speech/focal weakness/facial droop ?Psychiatric: No  concerns with depression, No  concerns with anxiety, No sleep problems, No mood problems ? ?Exam:  ?BP (!) 151/83   Pulse (!) 103   Resp 20   Ht 5' 1" (1.549 m)   Wt 145 lb (65.8 kg)   SpO2 99%   BMI 27.40 kg/m?  ?Constitutional: VS see above. General Appearance: alert, well-developed, well-nourished, NAD ?Eyes: Normal lids and conjunctive, non-icteric sclera ?Ears, Nose, Mouth, Throat: MMM, Normal external inspection  ears/nares/mouth/lips/gums. TM normal bilaterally.  ?Neck: No masses, trachea midline. No thyroid enlargement. No tenderness/mass appreciated. No lymphadenopathy ?Respiratory: Normal respiratory effort. no wheeze, no rhonchi, no rales ?Cardiovascular: S1/S2 normal, no murmur, no rub/gallop auscultated. RRR. No lower extremity edema. Pedal pulse II/IV bilaterally DP and PT. No carotid bruit or JVD. No abdominal aortic bruit. ?Gastrointestinal: Nontender, no masses. No hepatomegaly, no splenomegaly. No hernia appreciated. Bowel sounds normal. Rectal exam deferred.  ?Pelvic exam: normal external genitalia, vulva, vagina, cervix, uterus and adnexa, PAP: Pap smear done today. Chaperoned by Tiffany Stable, MA. ?Musculoskeletal: Gait normal. No clubbing/cyanosis of digits.  ?Neurological: Normal balance/coordination. No tremor. No cranial nerve deficit on limited exam. Motor and sensation intact and symmetric. Cerebellar reflexes intact.  ?Skin: warm, dry, intact. No rash/ulcer. No concerning nevi or subq nodules on limited exam.   ?Psychiatric: Normal judgment/insight. Normal mood and affect. Oriented x3.  ? ? ?ASSESSMENT/PLAN:  ? ?1. Annual physical exam ?Checking labs as below.  Patient will come back to have these done fasting.  Up-to-date on preventative care.  Wellness information provided with AVS. ?- CBC with Differential/Platelet ?- COMPLETE METABOLIC PANEL WITH GFR ?- Lipid panel ? ?2. Need for HPV vaccination ?HPV #1 given in office today.  Next due in 1 to 2 months with her third due in 6 months.  Okay to schedule these for nurse visits. ?- HPV 9-valent vaccine,Recombinat ? ?3. Type 1 diabetes mellitus with other specified complication ([place]) ?Managed by endocrinology.  Blood pressure is mildly elevated today however on most recent check, was in normal limits.  We will hold off on starting an antihypertensive at this point.  Urine microalbumin normal. ? ? ?Orders Placed This Encounter  ?Procedures  ? HPV  9-valent vaccine,Recombinat  ? CBC with Differential/Platelet  ? COMPLETE METABOLIC PANEL WITH GFR  ? Lipid panel  ? ? ?No orders of the defined types were placed in this encounter. ? ? ?Patient Instructions  ?Preventive Care 25-18 Years Old, Female ?Preventive care refers to lifestyle choices and visits with your health care provider that can promote health and wellness. Preventive care visits are also called wellness exams. ?What can I expect for my preventive care visit? ?Counseling ?During your preventive care visit, your health care provider may ask about your: ?Medical history, including: ?Past medical problems. ?Family medical history. ?Pregnancy history. ?Current health, including: ?Menstrual cycle. ?Method of birth control. ?Emotional well-being. ?Home life and relationship well-being. ?Sexual activity and sexual health. ?Lifestyle, including: ?Alcohol, nicotine or tobacco, and drug use. ?Access to firearms. ?Diet, exercise, and sleep habits. ?Work and work Statistician. ?Sunscreen use. ?Safety issues such as seatbelt and bike helmet use. ?Physical exam ?Your health care provider may check your: ?Height and weight. These may be used to calculate your BMI (body mass index). BMI is a measurement that tells if you are at a healthy weight. ?Waist circumference. This measures the distance around your waistline. This measurement also tells if you are at a healthy weight and may help predict your  risk of certain diseases, such as type 2 diabetes and high blood pressure. ?Heart rate and blood pressure. ?Body temperature. ?Skin for abnormal spots. ?What immunizations do I need? ?Vaccines are usually given at various ages, according to a schedule. Your health care provider will recommend vaccines for you based on your age, medical history, and lifestyle or other factors, such as travel or where you work. ?What tests do I need? ?Screening ?Your health care provider may recommend screening tests for certain conditions.  This may include: ?Pelvic exam and Pap test. ?Lipid and cholesterol levels. ?Diabetes screening. This is done by checking your blood sugar (glucose) after you have not eaten for a while (fasting). ?Hepatitis B test. ?Hepatitis C test.

## 2022-01-01 NOTE — Patient Instructions (Signed)
Preventive Care 61-27 Years Old, Female ?Preventive care refers to lifestyle choices and visits with your health care provider that can promote health and wellness. Preventive care visits are also called wellness exams. ?What can I expect for my preventive care visit? ?Counseling ?During your preventive care visit, your health care provider may ask about your: ?Medical history, including: ?Past medical problems. ?Family medical history. ?Pregnancy history. ?Current health, including: ?Menstrual cycle. ?Method of birth control. ?Emotional well-being. ?Home life and relationship well-being. ?Sexual activity and sexual health. ?Lifestyle, including: ?Alcohol, nicotine or tobacco, and drug use. ?Access to firearms. ?Diet, exercise, and sleep habits. ?Work and work Statistician. ?Sunscreen use. ?Safety issues such as seatbelt and bike helmet use. ?Physical exam ?Your health care provider may check your: ?Height and weight. These may be used to calculate your BMI (body mass index). BMI is a measurement that tells if you are at a healthy weight. ?Waist circumference. This measures the distance around your waistline. This measurement also tells if you are at a healthy weight and may help predict your risk of certain diseases, such as type 2 diabetes and high blood pressure. ?Heart rate and blood pressure. ?Body temperature. ?Skin for abnormal spots. ?What immunizations do I need? ?Vaccines are usually given at various ages, according to a schedule. Your health care provider will recommend vaccines for you based on your age, medical history, and lifestyle or other factors, such as travel or where you work. ?What tests do I need? ?Screening ?Your health care provider may recommend screening tests for certain conditions. This may include: ?Pelvic exam and Pap test. ?Lipid and cholesterol levels. ?Diabetes screening. This is done by checking your blood sugar (glucose) after you have not eaten for a while (fasting). ?Hepatitis B  test. ?Hepatitis C test. ?HIV (human immunodeficiency virus) test. ?STI (sexually transmitted infection) testing, if you are at risk. ?BRCA-related cancer screening. This may be done if you have a family history of breast, ovarian, tubal, or peritoneal cancers. ?Talk with your health care provider about your test results, treatment options, and if necessary, the need for more tests. ?Follow these instructions at home: ?Eating and drinking ? ?Eat a healthy diet that includes fresh fruits and vegetables, whole grains, lean protein, and low-fat dairy products. ?Take vitamin and mineral supplements as recommended by your health care provider. ?Do not drink alcohol if: ?Your health care provider tells you not to drink. ?You are pregnant, may be pregnant, or are planning to become pregnant. ?If you drink alcohol: ?Limit how much you have to 0-1 drink a day. ?Know how much alcohol is in your drink. In the [place] one drink equals one 12 oz bottle of beer (355 mL), one 5 oz glass of wine (148 mL), or one 1? oz glass of hard liquor (44 mL). ?Lifestyle ?Brush your teeth every morning and night with fluoride toothpaste. Floss one time each day. ?Exercise for at least 30 minutes 5 or more days each week. ?Do not use any products that contain nicotine or tobacco. These products include cigarettes, chewing tobacco, and vaping devices, such as e-cigarettes. If you need help quitting, ask your health care provider. ?Do not use drugs. ?If you are sexually active, practice safe sex. Use a condom or other form of protection to prevent STIs. ?If you do not wish to become pregnant, use a form of birth control. If you plan to become pregnant, see your health care provider for a prepregnancy visit. ?Find healthy ways to manage stress, such as: ?Meditation, yoga,  or listening to music. ?Journaling. ?Talking to a trusted person. ?Spending time with friends and family. ?Minimize exposure to UV radiation to reduce your risk of skin  cancer. ?Safety ?Always wear your seat belt while driving or riding in a vehicle. ?Do not drive: ?If you have been drinking alcohol. Do not ride with someone who has been drinking. ?If you have been using any mind-altering substances or drugs. ?While texting. ?When you are tired or distracted. ?Wear a helmet and other protective equipment during sports activities. ?If you have firearms in your house, make sure you follow all gun safety procedures. ?Seek help if you have been physically or sexually abused. ?What's next? ?Go to your health care provider once a year for an annual wellness visit. ?Ask your health care provider how often you should have your eyes and teeth checked. ?Stay up to date on all vaccines. ?This information is not intended to replace advice given to you by your health care provider. Make sure you discuss any questions you have with your health care provider. ?Document Revised: 03/22/2021 Document Reviewed: 03/22/2021 ?Elsevier Patient Education ? [place]. ? ?

## 2022-01-02 LAB — CYTOLOGY - PAP
Adequacy: ABSENT
Diagnosis: NEGATIVE

## 2022-01-03 ENCOUNTER — Encounter: Payer: Self-pay | Admitting: Medical-Surgical

## 2022-01-30 ENCOUNTER — Ambulatory Visit (INDEPENDENT_AMBULATORY_CARE_PROVIDER_SITE_OTHER): Payer: Commercial Managed Care - PPO | Admitting: Medical-Surgical

## 2022-01-30 VITALS — Temp 98.4°F | Ht 61.0 in | Wt 140.0 lb

## 2022-01-30 DIAGNOSIS — Z23 Encounter for immunization: Secondary | ICD-10-CM

## 2022-01-30 NOTE — Progress Notes (Signed)
Pt presented for 2nd HPV vaccine. No side effects from previous immunization. Tolerated well.  ? ?Location: R Deltoid ?

## 2022-01-30 NOTE — Progress Notes (Signed)
Agree with documentation as above. Her next HPV (#3 of 3) will be due on 07/04/2022. Please schedule accordingly. ? ?___________________________________________ ?Thayer Ohm, DNP, APRN, FNP-BC ?Primary Care and Sports Medicine ? [facility] ? ?

## 2022-01-30 NOTE — Progress Notes (Signed)
Patient scheduled.

## 2022-03-06 ENCOUNTER — Other Ambulatory Visit: Payer: Self-pay | Admitting: Medical-Surgical

## 2022-03-06 DIAGNOSIS — Z3041 Encounter for surveillance of contraceptive pills: Secondary | ICD-10-CM

## 2022-04-05 ENCOUNTER — Encounter: Payer: Self-pay | Admitting: Medical-Surgical

## 2022-07-05 ENCOUNTER — Ambulatory Visit: Payer: Commercial Managed Care - PPO | Admitting: Medical-Surgical

## 2022-07-22 DIAGNOSIS — L732 Hidradenitis suppurativa: Secondary | ICD-10-CM | POA: Diagnosis not present

## 2022-07-31 NOTE — Progress Notes (Deleted)
   Established Patient Office Visit  Subjective   Patient ID: Tiffany Saunders, female   DOB: 1996-05-11 Age: 26 y.o. MRN: [id number]   No chief complaint on file.   HPI Pleasant 26 year old female presenting today for the following:  Type 1 DM: has been able to establish with endocrinology.  Hidradenitis suppurativa:    Objective:    There were no vitals filed for this visit.  Physical Exam   No results found for this or any previous visit (from the past 24 hour(s)).   {Labs (Optional):23779}  The ASCVD Risk score (Arnett DK, et al., 2019) failed to calculate for the following reasons:   The 2019 ASCVD risk score is only valid for ages 5 to 70   Assessment & Plan:   No problem-specific Assessment & Plan notes found for this encounter.   No follow-ups on file.  ___________________________________________ Clearnce Sorrel, DNP, APRN, FNP-BC Primary Care and [place]

## 2022-08-01 ENCOUNTER — Ambulatory Visit: Payer: Medicaid Other | Admitting: Medical-Surgical

## 2022-08-01 DIAGNOSIS — L732 Hidradenitis suppurativa: Secondary | ICD-10-CM

## 2022-08-01 DIAGNOSIS — E1069 Type 1 diabetes mellitus with other specified complication: Secondary | ICD-10-CM

## 2022-08-01 DIAGNOSIS — Z23 Encounter for immunization: Secondary | ICD-10-CM

## 2022-09-17 DIAGNOSIS — Z3041 Encounter for surveillance of contraceptive pills: Secondary | ICD-10-CM | POA: Insufficient documentation

## 2022-09-17 NOTE — Progress Notes (Unsigned)
   Established Patient Office Visit  Subjective   Patient ID: Kmiyah Golub, female   DOB: 1996-07-03 Age: 26 y.o. MRN: [id number]   No chief complaint on file.   HPI Pleasant 26 year old female presenting today for the following:  Type 1 diabetes:  HPV vaccine:  Birth control:   Objective:    There were no vitals filed for this visit.  Physical Exam   No results found for this or any previous visit (from the past 24 hour(s)).   {Labs (Optional):23779}  The ASCVD Risk score (Arnett DK, et al., 2019) failed to calculate for the following reasons:   The 2019 ASCVD risk score is only valid for ages 63 to 16   Assessment & Plan:   No problem-specific Assessment & Plan notes found for this encounter.   No follow-ups on file.  ___________________________________________ Thayer Ohm, DNP, APRN, FNP-BC Primary Care and Sports Medicine [facility]

## 2022-09-18 ENCOUNTER — Ambulatory Visit (INDEPENDENT_AMBULATORY_CARE_PROVIDER_SITE_OTHER): Payer: Medicaid Other | Admitting: Medical-Surgical

## 2022-09-18 DIAGNOSIS — E1069 Type 1 diabetes mellitus with other specified complication: Secondary | ICD-10-CM

## 2022-09-18 DIAGNOSIS — Z23 Encounter for immunization: Secondary | ICD-10-CM

## 2022-09-18 DIAGNOSIS — Z3041 Encounter for surveillance of contraceptive pills: Secondary | ICD-10-CM

## 2022-09-18 DIAGNOSIS — Z91199 Patient's noncompliance with other medical treatment and regimen due to unspecified reason: Secondary | ICD-10-CM

## 2022-11-07 DIAGNOSIS — E103393 Type 1 diabetes mellitus with moderate nonproliferative diabetic retinopathy without macular edema, bilateral: Secondary | ICD-10-CM | POA: Diagnosis not present

## 2022-11-07 DIAGNOSIS — E1036 Type 1 diabetes mellitus with diabetic cataract: Secondary | ICD-10-CM | POA: Diagnosis not present

## 2022-11-07 DIAGNOSIS — H4423 Degenerative myopia, bilateral: Secondary | ICD-10-CM | POA: Diagnosis not present

## 2022-11-28 DIAGNOSIS — E1065 Type 1 diabetes mellitus with hyperglycemia: Secondary | ICD-10-CM | POA: Diagnosis not present

## 2022-11-28 DIAGNOSIS — E785 Hyperlipidemia, unspecified: Secondary | ICD-10-CM | POA: Diagnosis not present

## 2022-11-28 DIAGNOSIS — Z1330 Encounter for screening examination for mental health and behavioral disorders, unspecified: Secondary | ICD-10-CM | POA: Diagnosis not present

## 2022-11-28 DIAGNOSIS — E1069 Type 1 diabetes mellitus with other specified complication: Secondary | ICD-10-CM | POA: Diagnosis not present

## 2022-11-28 DIAGNOSIS — Z4681 Encounter for fitting and adjustment of insulin pump: Secondary | ICD-10-CM | POA: Diagnosis not present

## 2022-11-28 DIAGNOSIS — Z794 Long term (current) use of insulin: Secondary | ICD-10-CM | POA: Diagnosis not present

## 2022-11-28 LAB — HEMOGLOBIN A1C: Hemoglobin A1C: 9.8

## 2022-11-29 ENCOUNTER — Encounter: Payer: Self-pay | Admitting: Medical-Surgical

## 2023-03-05 ENCOUNTER — Encounter: Payer: Self-pay | Admitting: Medical-Surgical

## 2023-03-05 ENCOUNTER — Ambulatory Visit (INDEPENDENT_AMBULATORY_CARE_PROVIDER_SITE_OTHER): Payer: BC Managed Care – PPO | Admitting: Medical-Surgical

## 2023-03-05 VITALS — BP 138/84 | HR 108 | Resp 20 | Ht 61.0 in | Wt 160.3 lb

## 2023-03-05 DIAGNOSIS — Z3041 Encounter for surveillance of contraceptive pills: Secondary | ICD-10-CM

## 2023-03-05 DIAGNOSIS — E1069 Type 1 diabetes mellitus with other specified complication: Secondary | ICD-10-CM | POA: Diagnosis not present

## 2023-03-05 DIAGNOSIS — Z0000 Encounter for general adult medical examination without abnormal findings: Secondary | ICD-10-CM | POA: Diagnosis not present

## 2023-03-05 MED ORDER — NORGESTIMATE-ETH ESTRADIOL 0.25-35 MG-MCG PO TABS
1.0000 | ORAL_TABLET | Freq: Every day | ORAL | 3 refills | Status: DC
Start: 2023-03-05 — End: 2024-03-06

## 2023-03-05 NOTE — Progress Notes (Signed)
Complete physical exam  Patient: Tiffany Saunders   DOB: [birth year]   27 y.o. Female  MRN: [id number]  Subjective:    Chief Complaint  Patient presents with   Annual Exam    Tiffany Saunders is a 27 y.o. female who presents today for a complete physical exam. She reports consuming a diabetic diet. The patient does not participate in regular exercise at present. She generally feels well. She reports sleeping fairly well. She does not have additional problems to discuss today.   Most recent fall risk assessment:    03/05/2023    3:48 PM  Fall Risk   Falls in the past year? 0  Number falls in past yr: 0  Injury with Fall? 0  Risk for fall due to : No Fall Risks  Follow up Falls evaluation completed     Most recent depression screenings:    03/05/2023    3:48 PM 01/01/2022    4:56 PM  PHQ 2/9 Scores  PHQ - 2 Score 0 0  PHQ- 9 Score  0    Vision:Within last year, Dental: No current dental problems and Receives regular dental care, and STD: The patient reports a past history of: gonorrhea    Patient Care Team: Christen Butter, NP as PCP - General (Nurse Practitioner)   Outpatient Medications Prior to Visit  Medication Sig   blood glucose meter kit and supplies KIT Dispense based on patient and insurance preference. Use up to four times daily as directed. Please include lancets, test strips, control solution.   Continuous Blood Gluc Sensor (DEXCOM G6 SENSOR) MISC Apply topically as directed.   Continuous Blood Gluc Sensor (DEXCOM G6 SENSOR) MISC by Does not apply route.   Continuous Blood Gluc Transmit (DEXCOM G6 TRANSMITTER) MISC by Does not apply route.   Insulin Glargine (BASAGLAR KWIKPEN) 100 UNIT/ML SMARTSIG:16 Unit(s) SUB-Q Daily   insulin lispro (HUMALOG KWIKPEN) 100 UNIT/ML KwikPen Inject 1-8 Units into the skin 3 (three) times daily before meals.   Insulin Pen Needle 31G X 8 MM MISC USE 1 PEN NEEDLE FOUR TIMES DAILY FOR INSULIN INJECTIONS   metroNIDAZOLE (FLAGYL) 500 MG tablet  Take 1 tablet (500 mg total) by mouth 2 (two) times daily with a meal. DO NOT CONSUME ALCOHOL WHILE TAKING THIS MEDICATION.   [DISCONTINUED] norgestimate-ethinyl estradiol (ORTHO-CYCLEN) 0.25-35 MG-MCG tablet TAKE 1 TABLET BY MOUTH EVERY DAY   [DISCONTINUED] fluconazole (DIFLUCAN) 150 MG tablet Take 1 tablet (150 mg total) by mouth 2 (two) times daily as needed for up to 2 doses. Take one tablet on day 1. Take another table after 72 hours if symptoms have not resolved. (Patient not taking: Reported on 03/05/2023)   No facility-administered medications prior to visit.   Review of Systems  Constitutional:  Negative for chills, fever, malaise/fatigue and weight loss.  HENT:  Negative for congestion, ear pain, hearing loss, sinus pain and sore throat.   Eyes:  Negative for blurred vision, photophobia and pain.  Respiratory:  Negative for cough, shortness of breath and wheezing.   Cardiovascular:  Negative for chest pain, palpitations and leg swelling.  Gastrointestinal:  Negative for abdominal pain, constipation, diarrhea, heartburn, nausea and vomiting.  Genitourinary:  Negative for dysuria, frequency and urgency.  Musculoskeletal:  Positive for myalgias (Lower cervical/upper thoracic midline tenderness intermittently). Negative for falls and neck pain.  Skin:  Negative for itching and rash.  Neurological:  Negative for dizziness, weakness and headaches.  Endo/Heme/Allergies:  Negative for polydipsia. Does not bruise/bleed easily.  Psychiatric/Behavioral:  Negative for depression, substance abuse and suicidal ideas. The patient is not nervous/anxious and does not have insomnia.       Objective:     BP 138/84 (BP Location: Left Arm, Cuff Size: Normal)   Pulse (!) 108   Resp 20   Ht 5\' 1"  (1.549 m)   Wt 160 lb 4.8 oz (72.7 kg)   SpO2 98%   BMI 30.29 kg/m    Physical Exam   No results found for any visits on 03/05/23.     Assessment & Plan:    Routine Health Maintenance and Physical  Exam  Immunization History  Administered Date(s) Administered   HPV 9-valent 01/01/2022, 01/30/2022   Influenza-Unspecified 10/11/2012, 07/05/2017, 08/07/2020   Tdap 05/26/2015    Health Maintenance  Topic Date Due   OPHTHALMOLOGY EXAM  Never done   Hepatitis C Screening  Never done   Diabetic kidney evaluation - eGFR measurement  12/20/2021   HPV VACCINES (3 - 3-dose series) 07/04/2022   FOOT EXAM  11/18/2022   Diabetic kidney evaluation - Urine ACR  01/02/2023   COVID-19 Vaccine (1) 03/21/2023 (Originally 04/06/1996)   INFLUENZA VACCINE  05/09/2023   HEMOGLOBIN A1C  05/29/2023   PAP-Cervical Cytology Screening  01/01/2025   PAP SMEAR-Modifier  01/01/2025   DTaP/Tdap/Td (2 - Td or Tdap) 05/25/2025   HIV Screening  Completed    Discussed health benefits of physical activity, and encouraged her to engage in regular exercise appropriate for her age and condition.  1. Annual physical exam Checking labs as below.  Up-to-date on preventative care.  Wellness information provided with AVS. - Lipid panel - COMPLETE METABOLIC PANEL WITH GFR - CBC with Differential/Platelet  2. Visit for birth control pills maintenance Doing well on Ortho-Cyclen.  Refilling today. - norgestimate-ethinyl estradiol (ORTHO-CYCLEN) 0.25-35 MG-MCG tablet; Take 1 tablet by mouth daily.  Dispense: 84 tablet; Refill: 3  3. Type 1 diabetes mellitus with other specified complication [facility]) Now managed by endocrinology.  Return in about 1 year (around 03/04/2024) for annual physical exam or sooner if needed.   Christen Butter, NP

## 2023-03-06 LAB — COMPLETE METABOLIC PANEL WITH GFR
AG Ratio: 1.1 (calc) (ref 1.0–2.5)
ALT: 17 U/L (ref 6–29)
AST: 20 U/L (ref 10–30)
Albumin: 4.1 g/dL (ref 3.6–5.1)
Alkaline phosphatase (APISO): 60 U/L (ref 31–125)
BUN: 10 mg/dL (ref 7–25)
CO2: 20 mmol/L (ref 20–32)
Calcium: 9.9 mg/dL (ref 8.6–10.2)
Chloride: 102 mmol/L (ref 98–110)
Creat: 0.84 mg/dL (ref 0.50–0.96)
Globulin: 3.6 g/dL (calc) (ref 1.9–3.7)
Glucose, Bld: 259 mg/dL — ABNORMAL HIGH (ref 65–139)
Potassium: 3.9 mmol/L (ref 3.5–5.3)
Sodium: 139 mmol/L (ref 135–146)
Total Bilirubin: 0.4 mg/dL (ref 0.2–1.2)
Total Protein: 7.7 g/dL (ref 6.1–8.1)
eGFR: 98 mL/min/{1.73_m2} (ref >=60)

## 2023-03-06 LAB — CBC WITH DIFFERENTIAL/PLATELET
Absolute Monocytes: 384 cells/uL (ref 200–950)
Basophils Absolute: 36 cells/uL (ref 0–200)
Basophils Relative: 0.3 %
Eosinophils Absolute: 0 cells/uL — ABNORMAL LOW (ref 15–500)
Eosinophils Relative: 0 %
HCT: 41.9 % (ref 35.0–45.0)
Hemoglobin: 14.4 g/dL (ref 11.7–15.5)
Lymphs Abs: 2208 cells/uL (ref 850–3900)
MCH: 30.5 pg (ref 27.0–33.0)
MCHC: 34.4 g/dL (ref 32.0–36.0)
MCV: 88.8 fL (ref 80.0–100.0)
MPV: 10.9 fL (ref 7.5–12.5)
Monocytes Relative: 3.2 %
Neutro Abs: 9372 cells/uL — ABNORMAL HIGH (ref 1500–7800)
Neutrophils Relative %: 78.1 %
Platelets: 352 10*3/uL (ref 140–400)
RBC: 4.72 10*6/uL (ref 3.80–5.10)
RDW: 12.7 % (ref 11.0–15.0)
Total Lymphocyte: 18.4 %
WBC: 12 10*3/uL — ABNORMAL HIGH (ref 3.8–10.8)

## 2023-03-06 LAB — LIPID PANEL
Cholesterol: 281 mg/dL — ABNORMAL HIGH (ref <200)
HDL: 86 mg/dL (ref >=50)
LDL Cholesterol (Calc): 160 mg/dL (calc) — ABNORMAL HIGH
Non-HDL Cholesterol (Calc): 195 mg/dL (calc) — ABNORMAL HIGH (ref <130)
Total CHOL/HDL Ratio: 3.3 (calc) (ref <5.0)
Triglycerides: 191 mg/dL — ABNORMAL HIGH (ref <150)

## 2023-03-09 ENCOUNTER — Encounter: Payer: Self-pay | Admitting: Medical-Surgical

## 2023-03-20 DIAGNOSIS — E785 Hyperlipidemia, unspecified: Secondary | ICD-10-CM | POA: Diagnosis not present

## 2023-03-20 DIAGNOSIS — E1069 Type 1 diabetes mellitus with other specified complication: Secondary | ICD-10-CM | POA: Diagnosis not present

## 2023-03-20 DIAGNOSIS — E103393 Type 1 diabetes mellitus with moderate nonproliferative diabetic retinopathy without macular edema, bilateral: Secondary | ICD-10-CM | POA: Diagnosis not present

## 2023-03-20 DIAGNOSIS — Z4681 Encounter for fitting and adjustment of insulin pump: Secondary | ICD-10-CM | POA: Diagnosis not present

## 2023-03-20 DIAGNOSIS — E1065 Type 1 diabetes mellitus with hyperglycemia: Secondary | ICD-10-CM | POA: Diagnosis not present

## 2023-03-20 DIAGNOSIS — E083393 Diabetes mellitus due to underlying condition with moderate nonproliferative diabetic retinopathy without macular edema, bilateral: Secondary | ICD-10-CM | POA: Diagnosis not present

## 2023-03-25 ENCOUNTER — Encounter: Payer: Self-pay | Admitting: Medical-Surgical

## 2023-05-21 DIAGNOSIS — E039 Hypothyroidism, unspecified: Secondary | ICD-10-CM | POA: Diagnosis not present

## 2023-12-27 LAB — HEMOGLOBIN A1C: Hemoglobin A1C: 10.4

## 2024-03-06 ENCOUNTER — Encounter: Payer: Self-pay | Admitting: Medical-Surgical

## 2024-03-06 ENCOUNTER — Ambulatory Visit: Admitting: Medical-Surgical

## 2024-03-06 VITALS — BP 129/80 | HR 91 | Resp 20 | Ht 61.0 in | Wt 172.0 lb

## 2024-03-06 DIAGNOSIS — Z0000 Encounter for general adult medical examination without abnormal findings: Secondary | ICD-10-CM

## 2024-03-06 DIAGNOSIS — Z3041 Encounter for surveillance of contraceptive pills: Secondary | ICD-10-CM

## 2024-03-06 DIAGNOSIS — E1069 Type 1 diabetes mellitus with other specified complication: Secondary | ICD-10-CM | POA: Diagnosis not present

## 2024-03-06 DIAGNOSIS — L920 Granuloma annulare: Secondary | ICD-10-CM

## 2024-03-06 DIAGNOSIS — Z23 Encounter for immunization: Secondary | ICD-10-CM

## 2024-03-06 MED ORDER — NORGESTIMATE-ETH ESTRADIOL 0.25-35 MG-MCG PO TABS
1.0000 | ORAL_TABLET | Freq: Every day | ORAL | 3 refills | Status: AC
Start: 2024-03-06 — End: ?

## 2024-03-06 NOTE — Progress Notes (Signed)
 Complete physical exam  Patient: Tiffany Saunders   DOB: 31-Aug-1996   27 y.o. Female  MRN: [id number]  Subjective:     Chief Complaint  Patient presents with   Annual Exam    Genesys Coggeshall is a 28 y.o. female who presents today for a complete physical exam. She reports consuming a diabetic, variable calorie diet. The patient does not participate in regular exercise at present. She generally feels fairly well. She reports sleeping fairly well. She does not have additional problems to discuss today.    Most recent fall risk assessment:    03/05/2023    3:48 PM  Fall Risk   Falls in the past year? 0  Number falls in past yr: 0  Injury with Fall? 0  Risk for fall due to : No Fall Risks  Follow up Falls evaluation completed     Most recent depression screenings:    03/06/2024   10:47 AM 03/05/2023    3:48 PM  PHQ 2/9 Scores  PHQ - 2 Score 0 0    Vision:Within last year, Dental: No current dental problems and Receives regular dental care, and STD: The patient reports a past history of: chlamydia    Patient Care Team: Cherre Cornish, NP as PCP - General (Nurse Practitioner)   Outpatient Medications Prior to Visit  Medication Sig   blood glucose meter kit and supplies KIT Dispense based on patient and insurance preference. Use up to four times daily as directed. Please include lancets, test strips, control solution.   Continuous Blood Gluc Sensor (DEXCOM G6 SENSOR) MISC Apply topically as directed.   Continuous Blood Gluc Sensor (DEXCOM G6 SENSOR) MISC by Does not apply route.   Continuous Blood Gluc Transmit (DEXCOM G6 TRANSMITTER) MISC by Does not apply route.   Insulin  Glargine (BASAGLAR KWIKPEN) 100 UNIT/ML SMARTSIG:16 Unit(s) SUB-Q Daily   insulin  lispro (HUMALOG  KWIKPEN) 100 UNIT/ML KwikPen Inject 1-8 Units into the skin 3 (three) times daily before meals.   Insulin  Pen Needle 31G X 8 MM MISC USE 1 PEN NEEDLE FOUR TIMES DAILY FOR INSULIN  INJECTIONS   [DISCONTINUED]  norgestimate -ethinyl estradiol  (ORTHO-CYCLEN) 0.25-35 MG-MCG tablet Take 1 tablet by mouth daily.   [DISCONTINUED] metroNIDAZOLE  (FLAGYL ) 500 MG tablet Take 1 tablet (500 mg total) by mouth 2 (two) times daily with a meal. DO NOT CONSUME ALCOHOL WHILE TAKING THIS MEDICATION.   No facility-administered medications prior to visit.   Review of Systems  Constitutional:  Negative for chills, fever, malaise/fatigue and weight loss.  HENT:  Negative for congestion, ear pain, hearing loss, sinus pain and sore throat.   Eyes:  Negative for blurred vision, photophobia and pain.  Respiratory:  Negative for cough, shortness of breath and wheezing.   Cardiovascular:  Negative for chest pain, palpitations and leg swelling.  Gastrointestinal:  Negative for abdominal pain, constipation, diarrhea, heartburn, nausea and vomiting.  Genitourinary:  Negative for dysuria, frequency and urgency.  Musculoskeletal:  Negative for falls and neck pain.  Skin:  Positive for rash. Negative for itching.       Bump under left armpit  Neurological:  Negative for dizziness, weakness and headaches.  Endo/Heme/Allergies:  Negative for polydipsia. Does not bruise/bleed easily.  Psychiatric/Behavioral:  Negative for depression, substance abuse and suicidal ideas. The patient is not nervous/anxious.     Objective:    BP 129/80 (BP Location: Right Arm, Cuff Size: Normal)   Pulse 91   Resp 20   Ht 5\' 1"  (1.549 m)   Wt 172  lb (78 kg)   SpO2 99%   BMI 32.50 kg/m    Physical Exam Vitals reviewed.  Constitutional:      General: She is not in acute distress.    Appearance: Normal appearance. She is not ill-appearing.  HENT:     Head: Normocephalic and atraumatic.     Right Ear: Tympanic membrane, ear canal and external ear normal. There is no impacted cerumen.     Left Ear: Tympanic membrane, ear canal and external ear normal. There is no impacted cerumen.     Nose: Nose normal. No congestion or rhinorrhea.      Mouth/Throat:     Mouth: Mucous membranes are moist.     Pharynx: No oropharyngeal exudate or posterior oropharyngeal erythema.  Eyes:     General: No scleral icterus.       Right eye: No discharge.        Left eye: No discharge.     Extraocular Movements: Extraocular movements intact.     Conjunctiva/sclera: Conjunctivae normal.     Pupils: Pupils are equal, round, and reactive to light.  Neck:     Thyroid : No thyromegaly.     Vascular: No carotid bruit or JVD.     Trachea: Trachea normal.  Cardiovascular:     Rate and Rhythm: Normal rate and regular rhythm.     Pulses: Normal pulses.     Heart sounds: Normal heart sounds. No murmur heard.    No friction rub. No gallop.  Pulmonary:     Effort: Pulmonary effort is normal. No respiratory distress.     Breath sounds: Normal breath sounds. No wheezing.  Abdominal:     General: Bowel sounds are normal. There is no distension.     Palpations: Abdomen is soft.     Tenderness: There is no abdominal tenderness. There is no guarding.  Musculoskeletal:        General: Normal range of motion.     Cervical back: Normal range of motion and neck supple.  Lymphadenopathy:     Cervical: No cervical adenopathy.  Skin:    General: Skin is warm and dry.     Findings: Rash (granuloma annulare on bilateral lower legs) present.  Neurological:     Mental Status: She is alert and oriented to person, place, and time.     Cranial Nerves: No cranial nerve deficit.  Psychiatric:        Mood and Affect: Mood normal.        Behavior: Behavior normal.        Thought Content: Thought content normal.        Judgment: Judgment normal.    Results for orders placed or performed in visit on 03/06/24  Hemoglobin A1c  Result Value Ref Range   Hemoglobin A1C 10.4        Assessment & Plan:    Routine Health Maintenance and Physical Exam  Immunization History  Administered Date(s) Administered   HPV 9-valent 01/01/2022, 01/30/2022, 03/06/2024    Influenza-Unspecified 10/11/2012, 07/05/2017, 08/07/2020   PNEUMOCOCCAL CONJUGATE-20 03/06/2024   Tdap 05/26/2015    Health Maintenance  Topic Date Due   OPHTHALMOLOGY EXAM  Never done   Hepatitis C Screening  Never done   FOOT EXAM  11/18/2022   Diabetic kidney evaluation - Urine ACR  01/02/2023   Diabetic kidney evaluation - eGFR measurement  03/04/2024   COVID-19 Vaccine (1 - 2024-25 season) 03/22/2024 (Originally 06/09/2023)   INFLUENZA VACCINE  05/08/2024   HEMOGLOBIN A1C  06/28/2024   Cervical Cancer Screening (Pap smear)  01/01/2025   DTaP/Tdap/Td (2 - Td or Tdap) 05/25/2025   Pneumococcal Vaccine 28-38 Years old  Completed   HPV VACCINES  Completed   HIV Screening  Completed   Meningococcal B Vaccine  Aged Out    Discussed health benefits of physical activity, and encouraged her to engage in regular exercise appropriate for her age and condition.  1. Annual physical exam (Primary) Deferring lab as they were just done 2 months ago. UTD on preventative care. Wellness information provided with AVS.   2. Type 1 diabetes mellitus with other specified complication [facility]) Managed by endocrinology.   3. Visit for birth control pills maintenance Doing well. Continue Ortho-cyclen as prescribed.  - norgestimate -ethinyl estradiol  (ORTHO-CYCLEN) 0.25-35 MG-MCG tablet; Take 1 tablet by mouth daily.  Dispense: 84 tablet; Refill: 3  4. Granuloma annulare Referring to dermatology for reevaluation.  - Ambulatory referral to Dermatology  5. Immunization due HPV #3 and Prevnar 20 given in office today.  - HPV 9-valent vaccine,Recombinat - Pneumococcal conjugate vaccine 20-valent (Prevnar 20)  Return in about 1 year (around 03/06/2025) for annual physical exam or sooner if needed.     Kelon Easom, NP

## 2024-03-06 NOTE — Patient Instructions (Signed)

## 2024-06-03 ENCOUNTER — Telehealth: Payer: Self-pay

## 2024-06-03 NOTE — Telephone Encounter (Signed)
 Patient was identified as falling into the [place] Measure - Diabetes.   Patient is managed by Endocrinology.

## 2024-06-09 ENCOUNTER — Encounter: Payer: Self-pay | Admitting: Sports Medicine
